# Patient Record
Sex: Female | Born: 1945 | Race: White | Hispanic: No | Marital: Married | Smoking: Former smoker
Health system: Southern US, Community
[De-identification: ages and names within clinical notes are randomized; demographics above are authoritative.]

## PROBLEM LIST (undated history)

## (undated) DIAGNOSIS — K219 Gastro-esophageal reflux disease without esophagitis: Secondary | ICD-10-CM

## (undated) DIAGNOSIS — E7800 Pure hypercholesterolemia, unspecified: Secondary | ICD-10-CM

## (undated) DIAGNOSIS — I10 Essential (primary) hypertension: Secondary | ICD-10-CM

## (undated) DIAGNOSIS — F259 Schizoaffective disorder, unspecified: Secondary | ICD-10-CM

## (undated) DIAGNOSIS — F250 Schizoaffective disorder, bipolar type: Secondary | ICD-10-CM

## (undated) HISTORY — PX: MASTECTOMY: SHX3

---

## 1997-10-16 ENCOUNTER — Other Ambulatory Visit: Admission: RE | Admit: 1997-10-16 | Discharge: 1997-10-16 | Payer: Self-pay | Admitting: Endocrinology

## 1997-11-06 ENCOUNTER — Ambulatory Visit (HOSPITAL_COMMUNITY): Admission: RE | Admit: 1997-11-06 | Discharge: 1997-11-06 | Payer: Self-pay | Admitting: General Surgery

## 1998-01-16 ENCOUNTER — Other Ambulatory Visit: Admission: RE | Admit: 1998-01-16 | Discharge: 1998-01-16 | Payer: Self-pay | Admitting: Obstetrics and Gynecology

## 1998-02-05 ENCOUNTER — Ambulatory Visit (HOSPITAL_COMMUNITY): Admission: RE | Admit: 1998-02-05 | Discharge: 1998-02-05 | Payer: Self-pay | Admitting: Obstetrics and Gynecology

## 1998-03-10 ENCOUNTER — Inpatient Hospital Stay (HOSPITAL_COMMUNITY): Admission: RE | Admit: 1998-03-10 | Discharge: 1998-03-12 | Payer: Self-pay | Admitting: Obstetrics and Gynecology

## 1998-05-22 ENCOUNTER — Encounter: Payer: Self-pay | Admitting: General Surgery

## 1998-05-22 ENCOUNTER — Ambulatory Visit (HOSPITAL_COMMUNITY): Admission: RE | Admit: 1998-05-22 | Discharge: 1998-05-22 | Payer: Self-pay | Admitting: General Surgery

## 1998-06-01 ENCOUNTER — Ambulatory Visit (HOSPITAL_COMMUNITY): Admission: RE | Admit: 1998-06-01 | Discharge: 1998-06-01 | Payer: Self-pay | Admitting: General Surgery

## 1998-08-11 ENCOUNTER — Other Ambulatory Visit: Admission: RE | Admit: 1998-08-11 | Discharge: 1998-08-11 | Payer: Self-pay | Admitting: Obstetrics and Gynecology

## 1998-11-10 ENCOUNTER — Ambulatory Visit (HOSPITAL_COMMUNITY): Admission: RE | Admit: 1998-11-10 | Discharge: 1998-11-10 | Payer: Self-pay | Admitting: General Surgery

## 1998-11-13 ENCOUNTER — Other Ambulatory Visit: Admission: RE | Admit: 1998-11-13 | Discharge: 1998-11-13 | Payer: Self-pay | Admitting: Obstetrics and Gynecology

## 1999-03-08 ENCOUNTER — Other Ambulatory Visit: Admission: RE | Admit: 1999-03-08 | Discharge: 1999-03-08 | Payer: Self-pay | Admitting: Obstetrics and Gynecology

## 1999-05-13 ENCOUNTER — Encounter: Payer: Self-pay | Admitting: Endocrinology

## 1999-05-13 ENCOUNTER — Ambulatory Visit (HOSPITAL_COMMUNITY): Admission: RE | Admit: 1999-05-13 | Discharge: 1999-05-13 | Payer: Self-pay | Admitting: Endocrinology

## 1999-07-09 ENCOUNTER — Other Ambulatory Visit: Admission: RE | Admit: 1999-07-09 | Discharge: 1999-07-09 | Payer: Self-pay | Admitting: Obstetrics and Gynecology

## 1999-11-08 ENCOUNTER — Other Ambulatory Visit: Admission: RE | Admit: 1999-11-08 | Discharge: 1999-11-08 | Payer: Self-pay | Admitting: Obstetrics and Gynecology

## 2000-03-10 ENCOUNTER — Other Ambulatory Visit: Admission: RE | Admit: 2000-03-10 | Discharge: 2000-03-10 | Payer: Self-pay | Admitting: Obstetrics and Gynecology

## 2000-05-19 ENCOUNTER — Ambulatory Visit (HOSPITAL_COMMUNITY): Admission: RE | Admit: 2000-05-19 | Discharge: 2000-05-19 | Payer: Self-pay | Admitting: Obstetrics and Gynecology

## 2000-05-19 ENCOUNTER — Encounter: Payer: Self-pay | Admitting: Obstetrics and Gynecology

## 2000-09-18 ENCOUNTER — Other Ambulatory Visit: Admission: RE | Admit: 2000-09-18 | Discharge: 2000-09-18 | Payer: Self-pay | Admitting: Obstetrics and Gynecology

## 2000-11-27 ENCOUNTER — Encounter: Admission: RE | Admit: 2000-11-27 | Discharge: 2001-01-24 | Payer: Self-pay | Admitting: Endocrinology

## 2001-03-19 ENCOUNTER — Other Ambulatory Visit: Admission: RE | Admit: 2001-03-19 | Discharge: 2001-03-19 | Payer: Self-pay | Admitting: Obstetrics and Gynecology

## 2001-05-21 ENCOUNTER — Ambulatory Visit (HOSPITAL_COMMUNITY): Admission: RE | Admit: 2001-05-21 | Discharge: 2001-05-21 | Payer: Self-pay | Admitting: Endocrinology

## 2001-05-21 ENCOUNTER — Encounter: Payer: Self-pay | Admitting: Endocrinology

## 2001-09-20 ENCOUNTER — Other Ambulatory Visit: Admission: RE | Admit: 2001-09-20 | Discharge: 2001-09-20 | Payer: Self-pay | Admitting: Obstetrics and Gynecology

## 2002-03-25 ENCOUNTER — Other Ambulatory Visit: Admission: RE | Admit: 2002-03-25 | Discharge: 2002-03-25 | Payer: Self-pay | Admitting: Obstetrics and Gynecology

## 2002-05-24 ENCOUNTER — Ambulatory Visit (HOSPITAL_COMMUNITY): Admission: RE | Admit: 2002-05-24 | Discharge: 2002-05-24 | Payer: Self-pay | Admitting: Obstetrics and Gynecology

## 2002-05-24 ENCOUNTER — Encounter: Payer: Self-pay | Admitting: Obstetrics and Gynecology

## 2002-09-16 ENCOUNTER — Other Ambulatory Visit: Admission: RE | Admit: 2002-09-16 | Discharge: 2002-09-16 | Payer: Self-pay | Admitting: Obstetrics and Gynecology

## 2003-03-20 ENCOUNTER — Other Ambulatory Visit: Admission: RE | Admit: 2003-03-20 | Discharge: 2003-03-20 | Payer: Self-pay | Admitting: Obstetrics and Gynecology

## 2003-05-26 ENCOUNTER — Ambulatory Visit (HOSPITAL_COMMUNITY): Admission: RE | Admit: 2003-05-26 | Discharge: 2003-05-26 | Payer: Self-pay | Admitting: Obstetrics and Gynecology

## 2003-09-12 ENCOUNTER — Encounter: Admission: RE | Admit: 2003-09-12 | Discharge: 2003-09-12 | Payer: Self-pay | Admitting: Endocrinology

## 2003-09-25 ENCOUNTER — Encounter (INDEPENDENT_AMBULATORY_CARE_PROVIDER_SITE_OTHER): Payer: Self-pay | Admitting: *Deleted

## 2003-09-25 ENCOUNTER — Ambulatory Visit (HOSPITAL_COMMUNITY): Admission: RE | Admit: 2003-09-25 | Discharge: 2003-09-25 | Payer: Self-pay | Admitting: *Deleted

## 2003-10-13 ENCOUNTER — Ambulatory Visit (HOSPITAL_COMMUNITY): Admission: RE | Admit: 2003-10-13 | Discharge: 2003-10-13 | Payer: Self-pay | Admitting: General Surgery

## 2003-10-15 ENCOUNTER — Inpatient Hospital Stay (HOSPITAL_COMMUNITY): Admission: RE | Admit: 2003-10-15 | Discharge: 2003-10-21 | Payer: Self-pay | Admitting: General Surgery

## 2003-10-15 ENCOUNTER — Encounter (INDEPENDENT_AMBULATORY_CARE_PROVIDER_SITE_OTHER): Payer: Self-pay | Admitting: Specialist

## 2003-11-04 ENCOUNTER — Ambulatory Visit: Admission: RE | Admit: 2003-11-04 | Discharge: 2003-11-04 | Payer: Self-pay | Admitting: Oncology

## 2003-11-06 ENCOUNTER — Ambulatory Visit (HOSPITAL_COMMUNITY): Admission: RE | Admit: 2003-11-06 | Discharge: 2003-11-06 | Payer: Self-pay | Admitting: Oncology

## 2003-11-07 ENCOUNTER — Ambulatory Visit (HOSPITAL_COMMUNITY): Admission: RE | Admit: 2003-11-07 | Discharge: 2003-11-07 | Payer: Self-pay | Admitting: Oncology

## 2003-11-17 ENCOUNTER — Ambulatory Visit: Admission: RE | Admit: 2003-11-17 | Discharge: 2004-01-07 | Payer: Self-pay | Admitting: *Deleted

## 2003-11-28 ENCOUNTER — Ambulatory Visit (HOSPITAL_COMMUNITY): Admission: RE | Admit: 2003-11-28 | Discharge: 2003-11-28 | Payer: Self-pay | Admitting: General Surgery

## 2004-01-05 ENCOUNTER — Observation Stay (HOSPITAL_COMMUNITY): Admission: EM | Admit: 2004-01-05 | Discharge: 2004-01-06 | Payer: Self-pay | Admitting: Oncology

## 2004-01-26 ENCOUNTER — Ambulatory Visit (HOSPITAL_COMMUNITY): Admission: RE | Admit: 2004-01-26 | Discharge: 2004-01-26 | Payer: Self-pay | Admitting: Oncology

## 2004-05-07 ENCOUNTER — Ambulatory Visit: Payer: Self-pay | Admitting: Oncology

## 2004-06-01 ENCOUNTER — Ambulatory Visit (HOSPITAL_COMMUNITY): Admission: RE | Admit: 2004-06-01 | Discharge: 2004-06-01 | Payer: Self-pay | Admitting: Oncology

## 2004-06-25 ENCOUNTER — Other Ambulatory Visit: Admission: RE | Admit: 2004-06-25 | Discharge: 2004-06-25 | Payer: Self-pay | Admitting: *Deleted

## 2004-07-14 ENCOUNTER — Encounter: Admission: RE | Admit: 2004-07-14 | Discharge: 2004-07-14 | Payer: Self-pay | Admitting: Dermatology

## 2004-07-29 ENCOUNTER — Ambulatory Visit: Payer: Self-pay | Admitting: Oncology

## 2004-08-25 ENCOUNTER — Encounter: Admission: RE | Admit: 2004-08-25 | Discharge: 2004-08-25 | Payer: Self-pay | Admitting: Oncology

## 2004-09-16 ENCOUNTER — Ambulatory Visit: Payer: Self-pay | Admitting: Oncology

## 2004-11-18 ENCOUNTER — Ambulatory Visit: Payer: Self-pay | Admitting: Oncology

## 2005-01-03 ENCOUNTER — Ambulatory Visit: Payer: Self-pay | Admitting: Oncology

## 2005-01-21 ENCOUNTER — Encounter: Admission: RE | Admit: 2005-01-21 | Discharge: 2005-01-21 | Payer: Self-pay | Admitting: Endocrinology

## 2005-03-02 ENCOUNTER — Encounter: Admission: RE | Admit: 2005-03-02 | Discharge: 2005-03-02 | Payer: Self-pay | Admitting: Oncology

## 2005-03-04 ENCOUNTER — Ambulatory Visit: Payer: Self-pay | Admitting: Oncology

## 2005-05-30 ENCOUNTER — Ambulatory Visit: Payer: Self-pay | Admitting: Oncology

## 2005-07-15 ENCOUNTER — Ambulatory Visit: Payer: Self-pay | Admitting: Oncology

## 2005-10-17 ENCOUNTER — Ambulatory Visit: Payer: Self-pay | Admitting: Oncology

## 2005-10-26 ENCOUNTER — Encounter: Payer: Self-pay | Admitting: General Surgery

## 2005-11-07 IMAGING — CR DG CHEST 2V
2 series · 2 of 2 positions shown · non-contrast
Comparison: none.

CLINICAL DATA: Colon mass.  Preadmission chest x-ray.
 TWO VIEW CHEST

[view not recorded (1 of 2)]
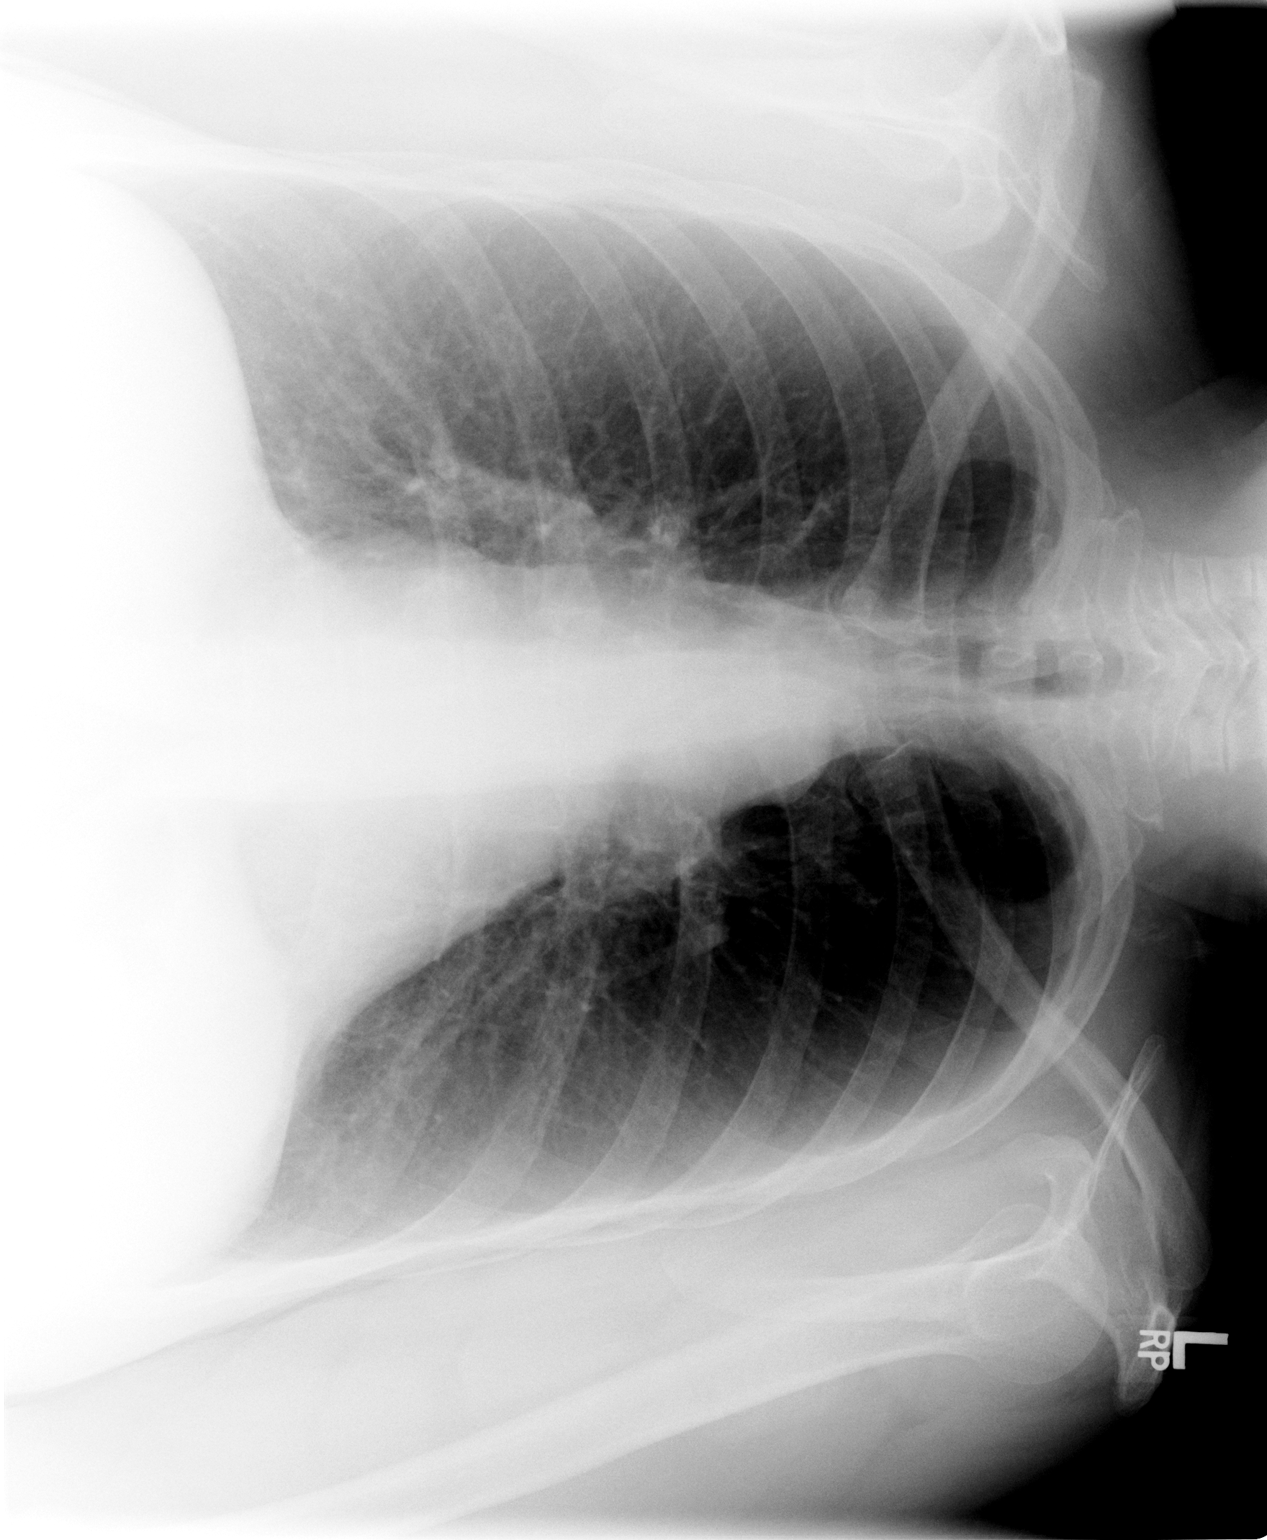

[view not recorded (2 of 2)]
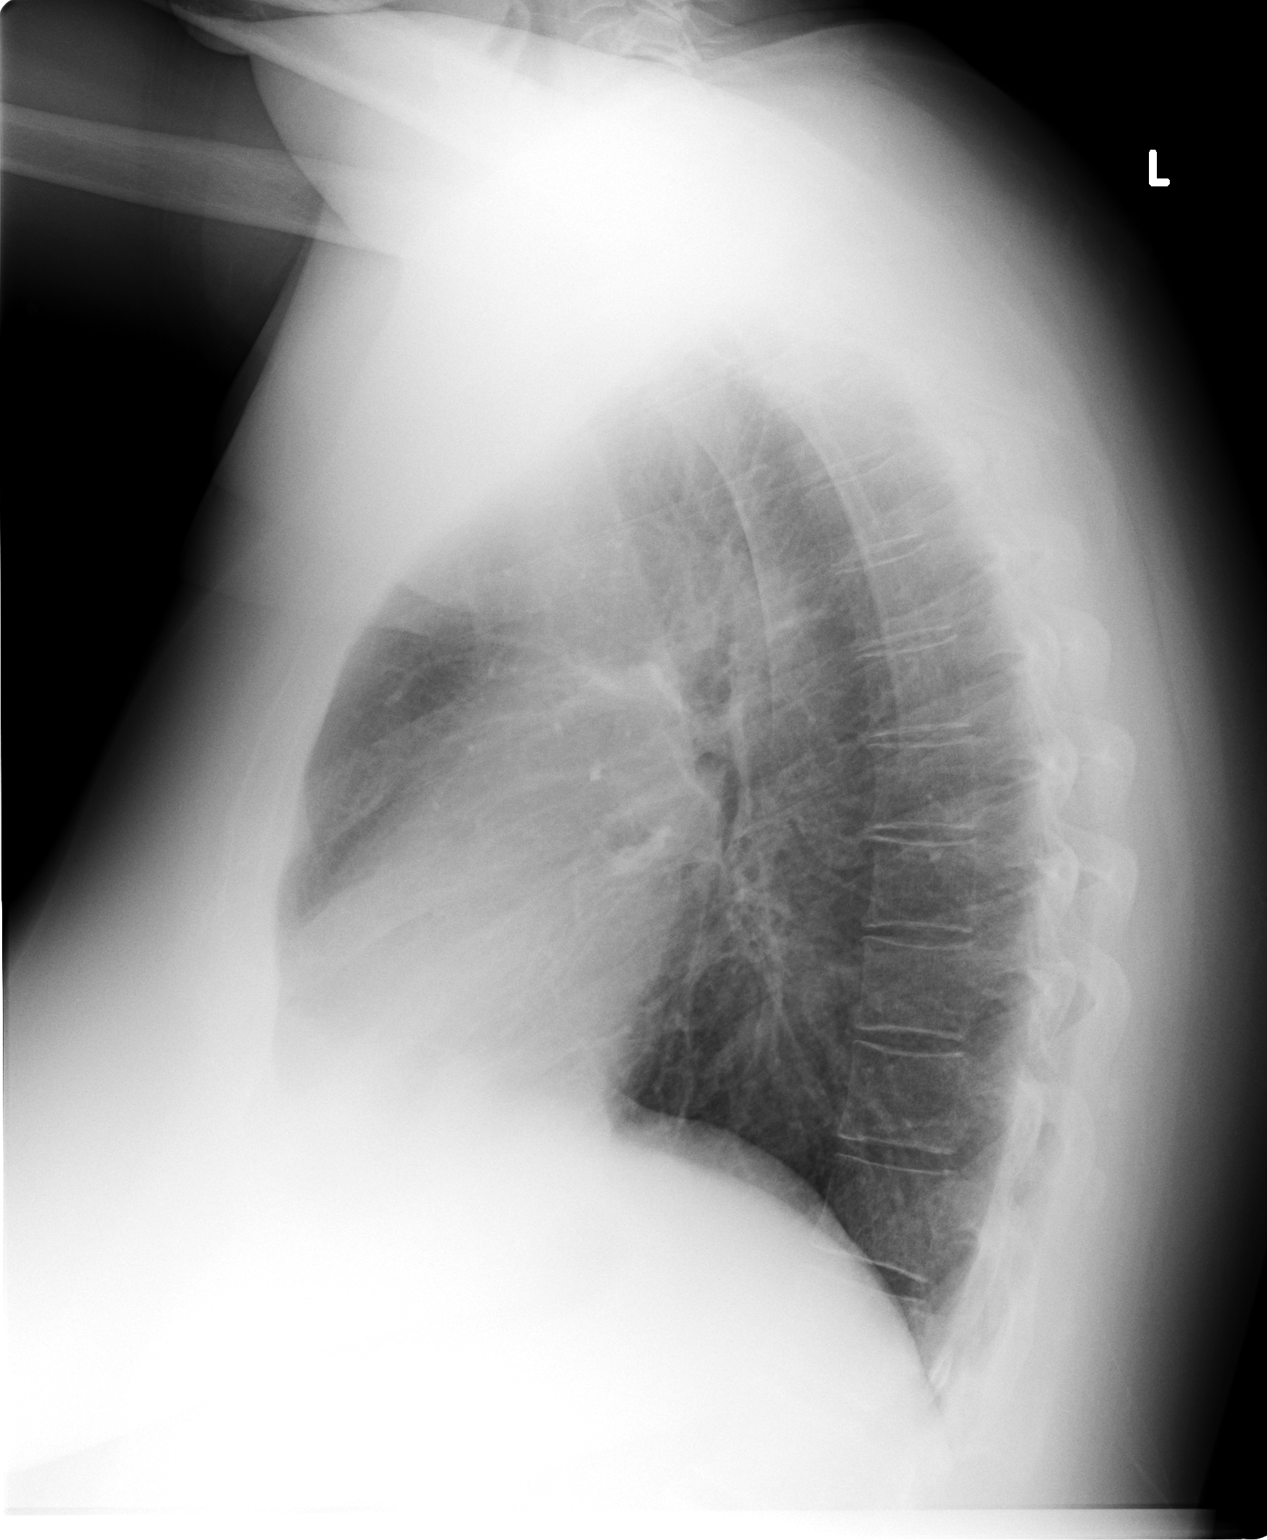

[2 of 2 positions shown; findings below may reference images not displayed]

FINDINGS: Two view exam of the chest shows no focal consolidation, edema or effusion.  The cardiopericardial silhouette is within normal limits for size.  The visualized bony structures are intact.
 IMPRESSION
 No acute cardiopulmonary process.

## 2005-12-05 ENCOUNTER — Ambulatory Visit (HOSPITAL_COMMUNITY): Admission: RE | Admit: 2005-12-05 | Discharge: 2005-12-05 | Payer: Self-pay | Admitting: Oncology

## 2005-12-05 ENCOUNTER — Ambulatory Visit: Payer: Self-pay | Admitting: Oncology

## 2005-12-05 LAB — COMPREHENSIVE METABOLIC PANEL
ALT: 17 U/L (ref 0–40)
AST: 21 U/L (ref 0–37)
BUN: 6 mg/dL (ref 6–23)
CO2: 31 mEq/L (ref 19–32)
Chloride: 85 mEq/L — ABNORMAL LOW (ref 96–112)
Creatinine, Ser: 0.88 mg/dL (ref 0.40–1.20)
Glucose, Bld: 124 mg/dL — ABNORMAL HIGH (ref 70–99)
Sodium: 128 mEq/L — ABNORMAL LOW (ref 135–145)
Total Bilirubin: 0.7 mg/dL (ref 0.3–1.2)
Total Protein: 6.8 g/dL (ref 6.0–8.3)

## 2005-12-12 LAB — BASIC METABOLIC PANEL
BUN: 8 mg/dL (ref 6–23)
Calcium: 8.8 mg/dL (ref 8.4–10.5)
Glucose, Bld: 106 mg/dL — ABNORMAL HIGH (ref 70–99)
Potassium: 4.1 mEq/L (ref 3.5–5.3)

## 2006-01-24 ENCOUNTER — Ambulatory Visit: Payer: Self-pay | Admitting: Oncology

## 2006-03-07 LAB — COMPREHENSIVE METABOLIC PANEL
ALT: 11 U/L (ref 0–40)
Chloride: 88 mEq/L — ABNORMAL LOW (ref 96–112)
Sodium: 127 mEq/L — ABNORMAL LOW (ref 135–145)
Total Bilirubin: 0.4 mg/dL (ref 0.3–1.2)

## 2006-03-07 LAB — CEA: CEA: <0.5 ng/mL (ref 0.0–5.0)

## 2006-04-21 ENCOUNTER — Ambulatory Visit: Payer: Self-pay | Admitting: Oncology

## 2006-04-25 LAB — COMPREHENSIVE METABOLIC PANEL
AST: 18 U/L (ref 0–37)
Albumin: 4.3 g/dL (ref 3.5–5.2)
Alkaline Phosphatase: 198 U/L — ABNORMAL HIGH (ref 39–117)
BUN: 9 mg/dL (ref 6–23)
Creatinine, Ser: 0.97 mg/dL (ref 0.40–1.20)
Glucose, Bld: 104 mg/dL — ABNORMAL HIGH (ref 70–99)
Potassium: 3.4 mEq/L — ABNORMAL LOW (ref 3.5–5.3)
Sodium: 125 mEq/L — ABNORMAL LOW (ref 135–145)
Total Bilirubin: 0.5 mg/dL (ref 0.3–1.2)
Total Protein: 7.2 g/dL (ref 6.0–8.3)

## 2006-04-25 LAB — CEA: CEA: <0.5 ng/mL (ref 0.0–5.0)

## 2006-06-05 ENCOUNTER — Ambulatory Visit (HOSPITAL_COMMUNITY): Admission: RE | Admit: 2006-06-05 | Discharge: 2006-06-05 | Payer: Self-pay | Admitting: Oncology

## 2006-06-06 ENCOUNTER — Ambulatory Visit: Payer: Self-pay | Admitting: Oncology

## 2006-07-01 ENCOUNTER — Emergency Department (HOSPITAL_COMMUNITY): Admission: EM | Admit: 2006-07-01 | Discharge: 2006-07-02 | Payer: Self-pay | Admitting: Emergency Medicine

## 2006-07-02 ENCOUNTER — Inpatient Hospital Stay (HOSPITAL_COMMUNITY): Admission: AD | Admit: 2006-07-02 | Discharge: 2006-07-02 | Payer: Self-pay | Admitting: *Deleted

## 2006-07-02 ENCOUNTER — Ambulatory Visit: Payer: Self-pay | Admitting: *Deleted

## 2006-07-03 ENCOUNTER — Inpatient Hospital Stay (HOSPITAL_COMMUNITY): Admission: EM | Admit: 2006-07-03 | Discharge: 2006-07-03 | Payer: Self-pay | Admitting: Emergency Medicine

## 2006-07-26 ENCOUNTER — Inpatient Hospital Stay (HOSPITAL_COMMUNITY): Admission: AD | Admit: 2006-07-26 | Discharge: 2006-07-31 | Payer: Self-pay | Admitting: *Deleted

## 2006-08-24 ENCOUNTER — Ambulatory Visit: Payer: Self-pay | Admitting: Oncology

## 2006-10-11 ENCOUNTER — Ambulatory Visit: Payer: Self-pay | Admitting: Oncology

## 2006-11-30 ENCOUNTER — Ambulatory Visit: Payer: Self-pay | Admitting: Oncology

## 2007-01-11 ENCOUNTER — Ambulatory Visit: Payer: Self-pay | Admitting: Oncology

## 2007-03-01 ENCOUNTER — Ambulatory Visit: Payer: Self-pay | Admitting: Oncology

## 2007-04-16 ENCOUNTER — Ambulatory Visit: Payer: Self-pay | Admitting: Oncology

## 2007-04-16 LAB — CEA: CEA: <0.5 ng/mL (ref 0.0–5.0)

## 2007-05-30 ENCOUNTER — Ambulatory Visit: Payer: Self-pay | Admitting: Oncology

## 2007-08-01 ENCOUNTER — Ambulatory Visit: Payer: Self-pay | Admitting: Oncology

## 2007-09-12 ENCOUNTER — Ambulatory Visit: Payer: Self-pay | Admitting: Oncology

## 2007-10-24 ENCOUNTER — Ambulatory Visit: Payer: Self-pay | Admitting: Oncology

## 2007-12-05 ENCOUNTER — Ambulatory Visit: Payer: Self-pay | Admitting: Oncology

## 2008-01-16 ENCOUNTER — Ambulatory Visit: Payer: Self-pay | Admitting: Oncology

## 2008-03-04 ENCOUNTER — Ambulatory Visit: Payer: Self-pay | Admitting: Oncology

## 2008-06-06 ENCOUNTER — Ambulatory Visit: Payer: Self-pay | Admitting: Oncology

## 2008-06-30 IMAGING — CT CT CHEST W/ CM
2 of 5 series · 16 of 46 positions shown, 18 images · IV contrast (omnipaque)
Comparison: CTs of the chest, abdomen, and pelvis done 12/05/05.

CLINICAL DATA: Colon cancer.  Status post colon resection in 9441.  Restaging post-chemotherapy.
CHEST CT WITH CONTRAST ? 06/05/06:
TECHNIQUE: Multidetector CT imaging of the chest was performed following the standard protocol during bolus administration of intravenous contrast.   
Contrast:  150 cc Omnipaque 300 IV.
TECHNIQUE: Multidetector CT imaging of the abdomen was performed following the standard protocol during bolus administration of intravenous contrast.
TECHNIQUE: Multidetector CT imaging of the pelvis was performed following the standard protocol during bolus administration of intravenous contrast.

[Series 2: cap 5.0 b40f · axial · 0.82mm/px · z∈[-648,-88]mm · 13 of 126 slices shown, 15 images]
[im 7/126  soft-tissue]
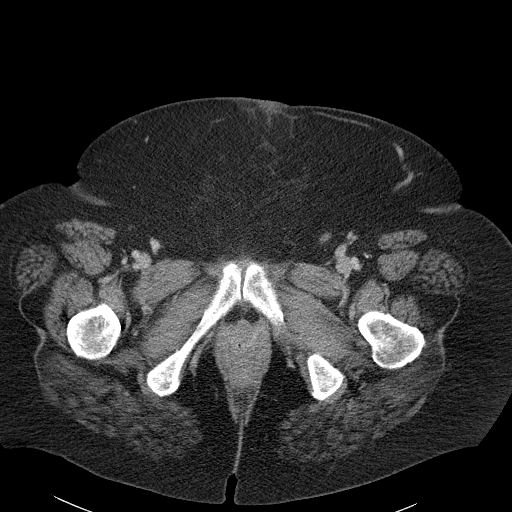
[im 7/126  bone]
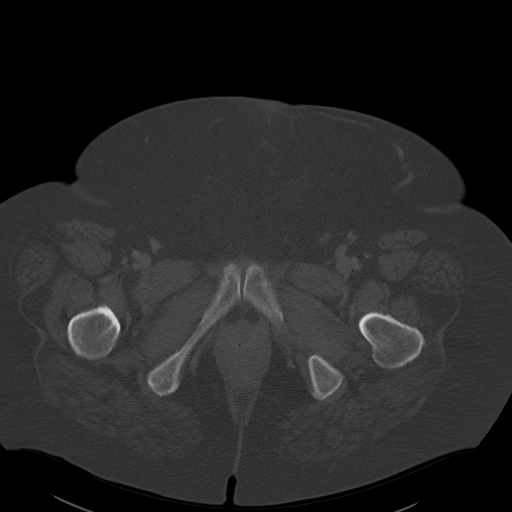
[im 20/126  soft-tissue]
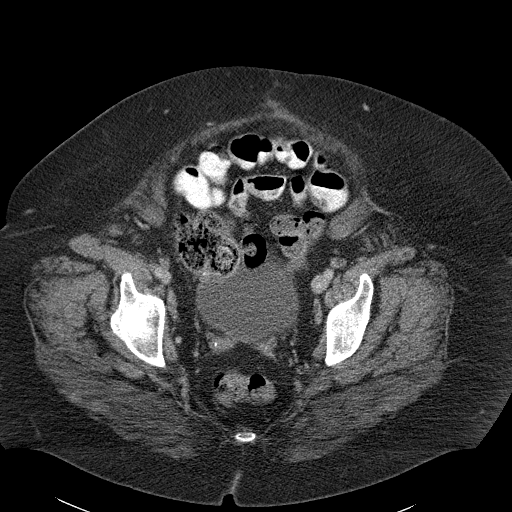
[im 27/126  soft-tissue]
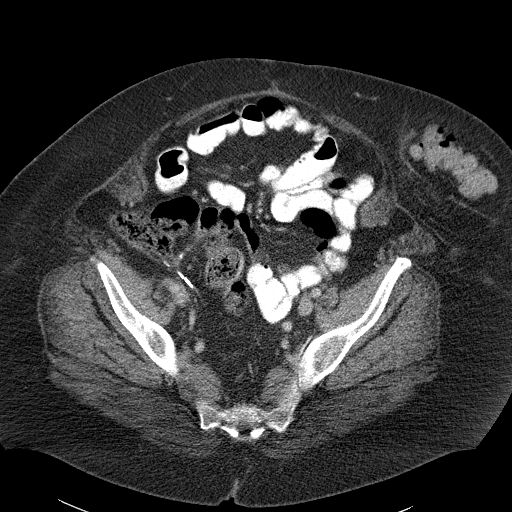
[im 33/126  soft-tissue]
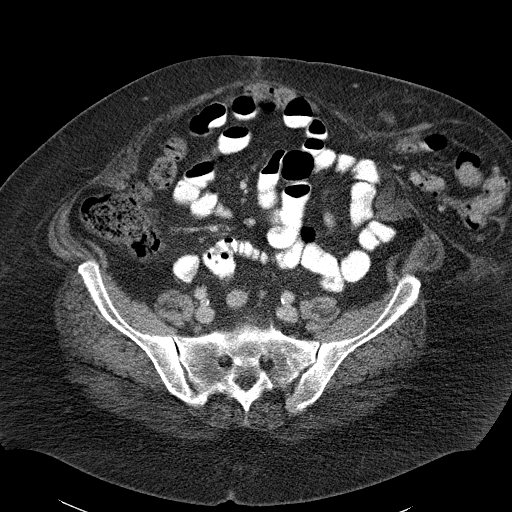
[im 47/126  soft-tissue]
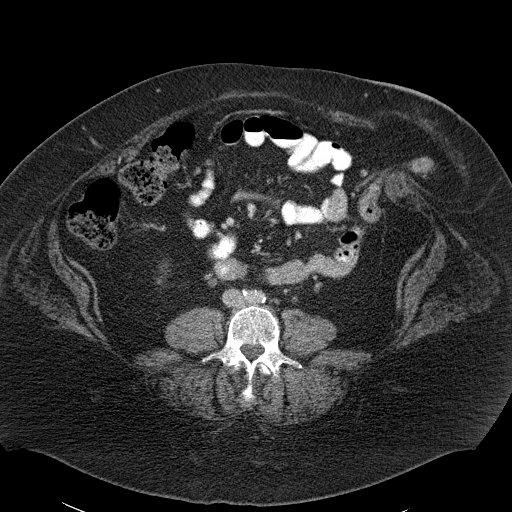
[im 53/126  soft-tissue]
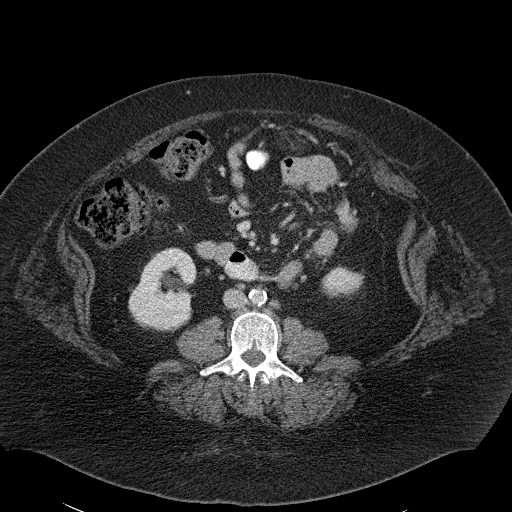
[im 66/126  soft-tissue]
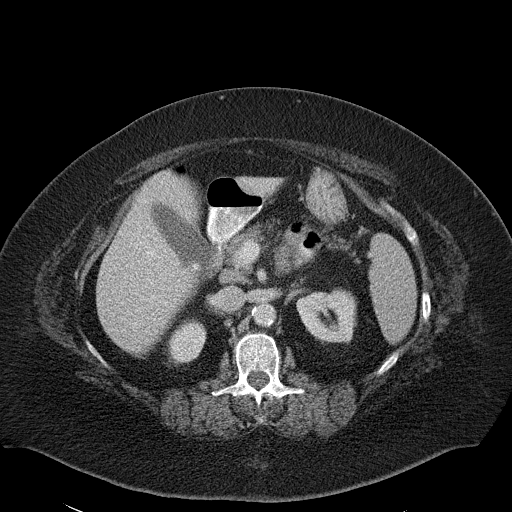
[im 73/126  soft-tissue]
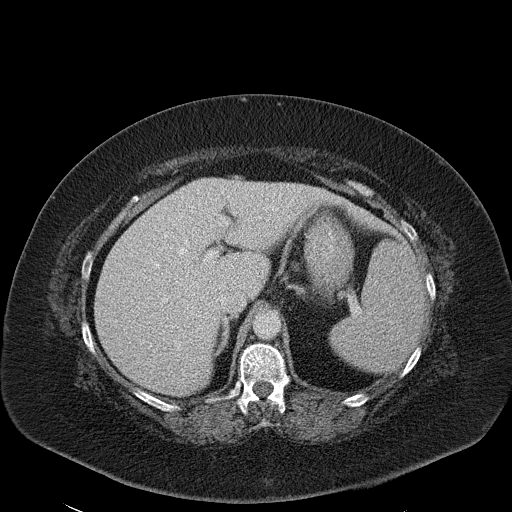
[im 79/126  soft-tissue]
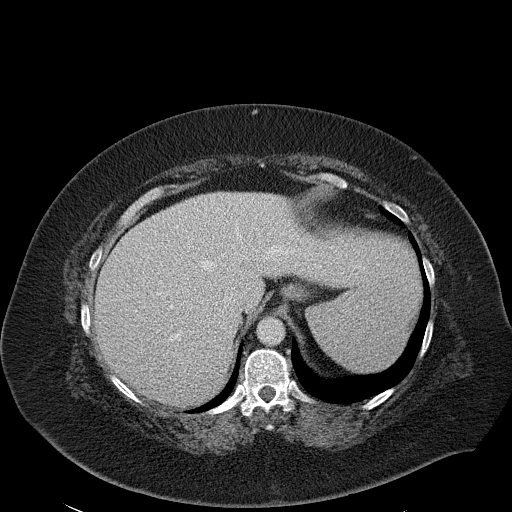
[im 79/126  bone]
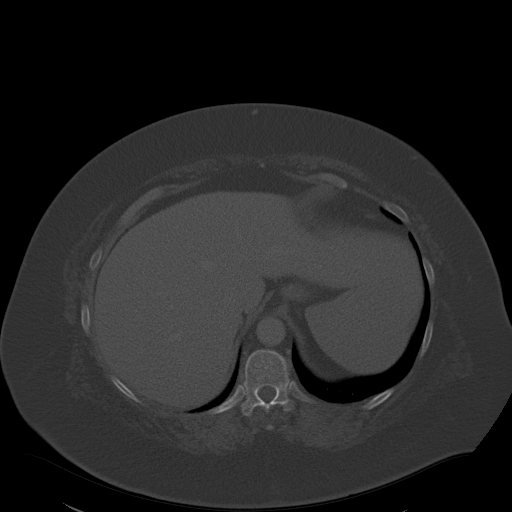
[im 93/126  soft-tissue]
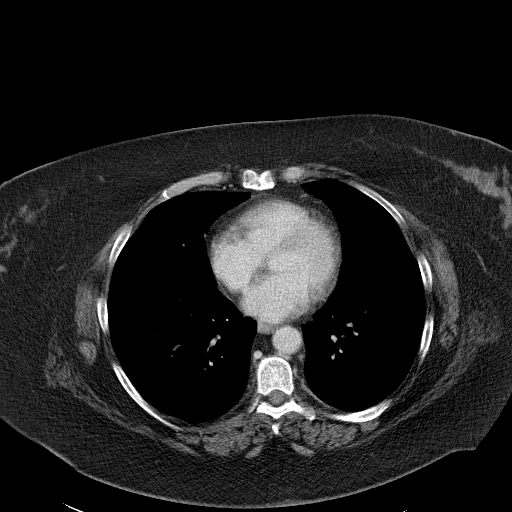
[im 99/126  soft-tissue]
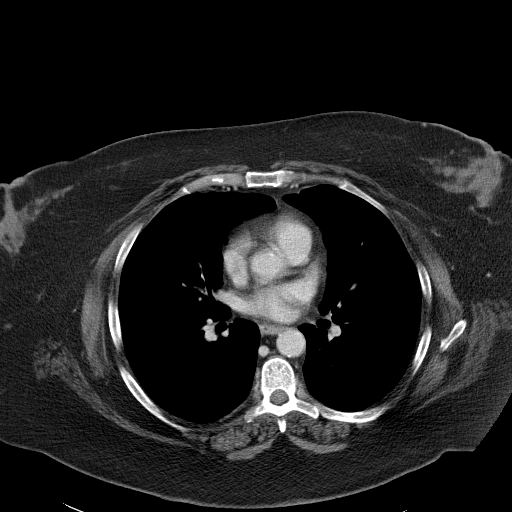
[im 106/126  soft-tissue]
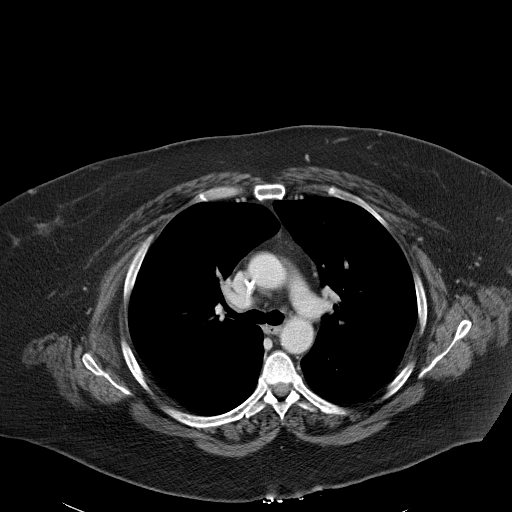
[im 119/126  soft-tissue]
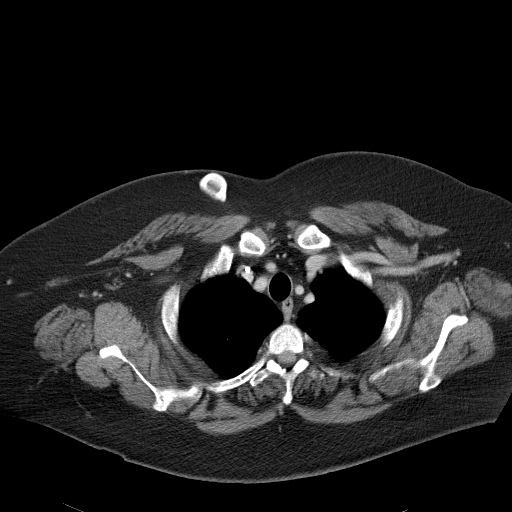

[Series 602: <mpr thick range> · coronal · 1.23mm/px · 3 of 92 slices shown]
[im 31/92  soft-tissue]
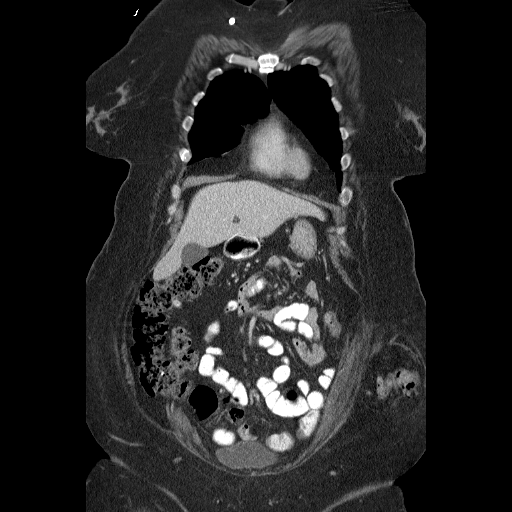
[im 41/92  soft-tissue]
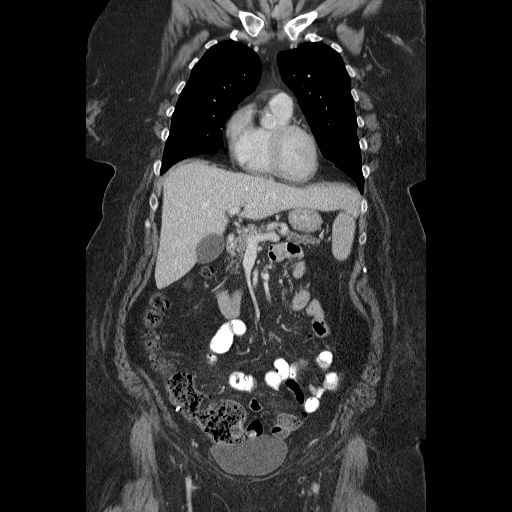
[im 51/92  soft-tissue]
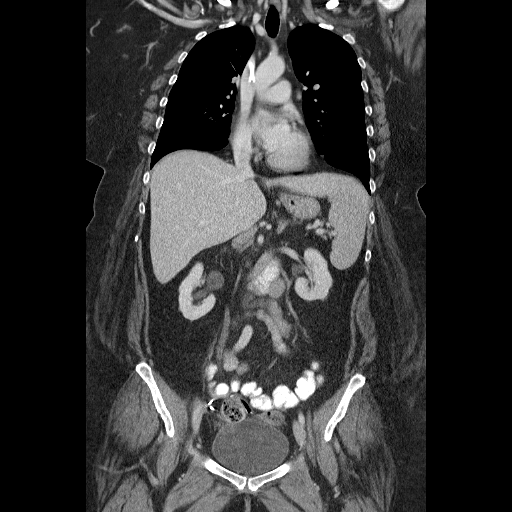

[16 of 46 positions shown; findings below may reference images not displayed]

FINDINGS: There is mild scarring at the lung apices.  The lungs are otherwise clear.  There is no pleural or pericardial effusion.  No pathologically enlarged lymph nodes are identified.  The Port-a-cath tip remains in the SVC.
IMPRESSION: Stable CT of the chest.  No evidence of metastatic disease.
ABDOMEN CT WITH CONTRAST ? 06/05/06:
FINDINGS: The liver has a stable appearance without evidence of metastases.  Small gallstones are again noted.  There is no gallbladder wall thickening.  The spleen, pancreas, adrenal glands, and kidneys appear stable.  In the upper pole of the left kidney, there is a stable bilobed or septated cyst measuring up to 3.8 cm in diameter.  No enlarged abdominal lymph nodes are seen.  There is no ascites.
IMPRESSION: Stable CT of the abdomen.  No evidence of metastatic disease.  Gallstones and a left renal cyst are again noted.
PELVIS CT WITH CONTRAST ? 06/05/06:
FINDINGS: A transverse colostomy in the left lower quadrant is again noted with an associated parastomal hernia containing additional loops of colon.  There is no evidence of bowel incarceration or obstruction.  No pelvic mass, fluid collection, or inflammatory process is demonstrated.  The uterus is surgically absent.
IMPRESSION: Stable postoperative CT of the pelvis with a parastomal hernia in the left lower quadrant.  No evidence of local recurrence or metastatic disease.

## 2008-07-22 ENCOUNTER — Ambulatory Visit: Payer: Self-pay | Admitting: Oncology

## 2008-10-10 ENCOUNTER — Ambulatory Visit: Payer: Self-pay | Admitting: Oncology

## 2008-11-25 ENCOUNTER — Ambulatory Visit: Payer: Self-pay | Admitting: Oncology

## 2009-01-02 ENCOUNTER — Ambulatory Visit: Payer: Self-pay | Admitting: Oncology

## 2009-02-13 ENCOUNTER — Ambulatory Visit: Payer: Self-pay | Admitting: Oncology

## 2009-03-27 ENCOUNTER — Ambulatory Visit: Payer: Self-pay | Admitting: Oncology

## 2009-05-08 ENCOUNTER — Ambulatory Visit: Payer: Self-pay | Admitting: Oncology

## 2009-06-18 ENCOUNTER — Ambulatory Visit: Payer: Self-pay | Admitting: Oncology

## 2009-07-30 ENCOUNTER — Ambulatory Visit: Payer: Self-pay | Admitting: Oncology

## 2009-09-10 ENCOUNTER — Ambulatory Visit: Payer: Self-pay | Admitting: Oncology

## 2009-10-23 ENCOUNTER — Ambulatory Visit: Payer: Self-pay | Admitting: Oncology

## 2009-12-03 ENCOUNTER — Ambulatory Visit: Payer: Self-pay | Admitting: Oncology

## 2010-01-14 ENCOUNTER — Ambulatory Visit: Payer: Self-pay | Admitting: Oncology

## 2010-02-24 ENCOUNTER — Ambulatory Visit: Payer: Self-pay | Admitting: Oncology

## 2010-04-08 ENCOUNTER — Ambulatory Visit: Payer: Self-pay | Admitting: Oncology

## 2010-05-19 ENCOUNTER — Ambulatory Visit: Payer: Self-pay | Admitting: Oncology

## 2010-07-01 ENCOUNTER — Ambulatory Visit: Payer: Self-pay | Admitting: Oncology

## 2010-07-18 ENCOUNTER — Encounter: Payer: Self-pay | Admitting: Oncology

## 2010-08-16 ENCOUNTER — Encounter (HOSPITAL_BASED_OUTPATIENT_CLINIC_OR_DEPARTMENT_OTHER): Payer: Medicare Other | Admitting: Oncology

## 2010-08-16 DIAGNOSIS — Z452 Encounter for adjustment and management of vascular access device: Secondary | ICD-10-CM

## 2010-08-16 DIAGNOSIS — Z85038 Personal history of other malignant neoplasm of large intestine: Secondary | ICD-10-CM

## 2010-09-27 ENCOUNTER — Encounter (HOSPITAL_BASED_OUTPATIENT_CLINIC_OR_DEPARTMENT_OTHER): Payer: Medicare Other | Admitting: Oncology

## 2010-09-27 ENCOUNTER — Other Ambulatory Visit: Payer: Self-pay | Admitting: Oncology

## 2010-09-27 DIAGNOSIS — Z85038 Personal history of other malignant neoplasm of large intestine: Secondary | ICD-10-CM

## 2010-09-27 LAB — CEA: CEA: 0.8 ng/mL (ref 0.0–5.0)

## 2010-11-09 ENCOUNTER — Encounter (HOSPITAL_BASED_OUTPATIENT_CLINIC_OR_DEPARTMENT_OTHER): Payer: Medicare Other | Admitting: Oncology

## 2010-11-09 DIAGNOSIS — Z85038 Personal history of other malignant neoplasm of large intestine: Secondary | ICD-10-CM

## 2010-11-09 DIAGNOSIS — Z452 Encounter for adjustment and management of vascular access device: Secondary | ICD-10-CM

## 2010-11-12 NOTE — Op Note (Signed)
NAME:  Diane Schroeder, Diane Schroeder                            ACCOUNT NO.:  [id number]   MEDICAL RECORD NO.:  [id number]                   PATIENT TYPE:  AMB   LOCATION:  DAY                                  FACILITY:  [facility]   PHYSICIAN:  Angelia Mould. Derrell Lolling, M.D.             DATE OF BIRTH:  1945/12/22   DATE OF PROCEDURE:  11/28/2003  DATE OF DISCHARGE:                                 OPERATIVE REPORT   PREOPERATIVE DIAGNOSIS:  Colon cancer.   POSTOPERATIVE DIAGNOSIS:  Colon cancer.   OPERATION PERFORMED:  Insertion of Bard Port venous vascular access device.   SURGEON:  Dr. Claud Kelp   OPERATIVE INDICATIONS:  This is Schroeder 65 year old white female with Schroeder history of  colon cancer.  Her cancer was complicated by extension and invasion of the  right iliac lymph node chain and right iliac blood vessels, and she  underwent resection, but she has residual tumor in the right iliac area.  Dr. Jackelyn Knife and Dr. Mancel Bale are going to give her chemotherapy and  radiation therapy.  Dr. Truett Perna requested that I place Schroeder Port-Schroeder-Cath.  The  patient has been counseled regarding this procedure and its risks.  She is  agreeable to this and is brought to the operating room electively.   OPERATIVE TECHNIQUE:  The patient was placed supine on the operating table.  Her arms were placed at her sides and rolled behind her shoulders.  The neck  and chest was prepped and draped in Schroeder sterile fashion.  Xylocaine 1% with  epinephrine was used as Schroeder local infiltration anesthetic.  With the patient  in Trendelenburg position, I performed Schroeder right internal jugular  venipuncture.  Schroeder guidewire was inserted into the superior vena cava under  fluoroscopic guidance.  Schroeder transverse incision was made approximately 2 cm  below the right clavicle.  Subcutaneous pocket was created.  Schroeder subcutaneous  tunnel was created with Schroeder tunneling device and the catheter withdrawn from  the neck incision down into the chest incision.  The  catheter was measured  an appropriate length and secured to the port.  The port and the catheter  were then flushed with heparinized saline.  The port was sutured to the  chest wall in two interrupted sutures of 3-0 Vicryl.  The patient developed  some ventricular bigeminy, and we pulled the guidewire back several times,  but this persisted.  Ultimately, she had to have some beta blocker given,  and that resolved the ventricular bigeminy.  With the patient back in  Trendelenburg position, we inserted Schroeder dilator and peel-away-sheath over the  guidewire.  The guidewire and dilator were removed.  The catheter was  threaded through the peel-away-sheath into the central venous circulation.  The peel-away-sheath was removed.  We had excellent blood return, and the  catheter flushed easily.  Fluoroscopy was performed and showed that the  catheter tip was near  the right atrium.  The patient had Schroeder little bit of  atrial arrhythmia, and so we shortened the catheter about 3 cm to where the  tip of the catheter was clearly in the superior vena cava.  We then re-  secured this to the port and then resutured the port to the chest wall with  two interrupted sutures of 3-0 Vicryl.  We then performed fluoroscopy and  confirmed that we were happy with the location of the catheter tip, as there  was no kinking or crimping of the catheter anywhere.  The catheter had  excellent blood return, and it flushed easily.  We then flushed it with  concentrated heparin.  Subcutaneous tissue was closed with interrupted  sutures of 3-0 Vicryl.  The skin incisions were closed with  subcuticular sutures of 4-0 Vicryl and Steri-Strips.  Clean bandages were  placed and the patient taken to the recovery room in stable condition.  Estimated blood loss was about 15 mL.  Complications were none.  Sponge,  needle, and instrument counts were correct.                                               Angelia Mould. Derrell Lolling, M.D.     HMI/MEDQ  D:  11/28/2003  T:  11/28/2003  Job:  [id number]   cc:   Jillyn Hidden B. Truett Perna, M.D.  [address] [facility]  [place]  [place] [address]  Fax: [phone]   Brooke Bonito, M.D.  [address]  [place]  [place] [address]  Fax: [phone]
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
## 2010-11-12 NOTE — H&P (Signed)
NAME:  Diane Schroeder, Diane Schroeder                  ACCOUNT NO.:  [id number]   MEDICAL RECORD NO.:  [id number]          PATIENT TYPE:  IPS   LOCATION:  0400                          [facility]ACILITY:  [facility]   PHYSICIAN:  Jasmine Pang, M.D. DATE OF BIRTH:  12/19/1945   DATE OF ADMISSION:  07/26/2006  DATE OF DISCHARGE:                       PSYCHIATRIC ADMISSION ASSESSMENT   IDENTIFYING INFORMATION:  This is a 65 year old married white female.  This is an involuntary admission.   HISTORY OF PRESENT ILLNESS:  This pleasant 76 year old mother of two was  petitioned by her family yesterday after she became somewhat aggressive  with her daughter, whom today she reports she believed was trying to  harm her.  The patient reports that she was diagnosed with schizophrenia  approximately 30 years ago, stopped taking all of her medicines about  one week ago, thinking hopefully that maybe she did not need them  anymore.  After about five days she began to experience auditory  hallucinations similar to past hallucinations.  She began to get  paranoid, slightly agitated, and became aggressive with her daughter.  Today she is fully alert, pleasant, cooperative, relaxed, focused, and  no signs of internal distraction.  Regrets stopping her medications.  Denies that she was having any particular side effects.  Denies any mood  lability, mania, or suicidal or homicidal thought.   PAST PSYCHIATRIC HISTORY:  The patient is currently followed as an  outpatient at [facility] by Dr. Jules Schick, MD, and  Lupita Leash is her counselor.  This is a second admission to [facility]  behavioral health center.  The patient was here briefly for a day  earlier this month but was transferred to the medical unit because of  some medical complications.  She reports being diagnosed with  schizophrenia approximately 30 years ago, in her younger years, at which  time she began hearing auditory hallucinations.  She denies any  family  history of auditory hallucinations.  She denies any history of mood  lability, mania or substance abuse.  Since that time she has been quite  stable over the years on Haldol with various benzodiazepines.  She was  hospitalized at that time.  She has no known history of suicide attempts  or history of violence.   SOCIAL HISTORY:  The patient has been married for 41 years, has two  grown daughters that live in the area and several grandchildren.  Keeps  a 1 year old granddaughter frequently with her at home.  Has a sister  who lives nearby in [place].  She is the youngest of 7 children, born  and raised in [place], [place].  She reports her marriage is  stable, her husband is supportive.  Relationships are good.  Finances  are stable.  Denies any psychosocial stressors.  Husband is disabled for  many years.  No legal issues.  The patient is completely independent in  all her self-care and medication management.  She has had some issues  with some medical problems in the past couple  of years and feels that  she is coping fairly well with it.  Her [employer] faith is one of her main  coping mechanisms.   ALCOHOL AND DRUG HISTORY:  The patient has a history of 35-year use of  tobacco, two packs per day, which she has stopped using tobacco about  two years ago.  Nondrinker.  No substance abuse.   PAST MEDICAL HISTORY:  The patient is currently followed by Dr. Truett Perna  at the cancer center and has been previously seen by Dr. Claud Kelp,  M.D., her general surgeon, for her colostomy.  Medical problems are post  colon cancer with colostomy in place.  She currently also has a Port-A-  Cath in her chest which requires flushing every six weeks.  She has a  history of dyslipidemia.   MEDICATIONS:  1. Klor-Con 20 mEq daily.  2. Pamelor 70 mg p.o. daily at night for depression.  3. Haldol 2 mg p.o. q.a.m.  4. Restoril 30 mg p.o. h.s. p.r.n. insomnia.  5. Flexeril 10 mg  t.i.d.  6. Valium 5 mg q.i.d.  7. Zantac 150 mg b.i.d.  8. Lipitor 20 mg daily.  9. Phenergan 25 mg q.8h. p.r.n. for nausea or vomiting.  10.Vicodin 5/500 t.i.d. p.r.n. pain.  11.Vistaril 25 mg q.i.d. p.r.n.  12.Prilosec 20 mg daily.   DRUG ALLERGIES:  No known drug allergies.   POSITIVE PHYSICAL FINDINGS:  The patient was medically cleared in the  emergency room.  Full review of systems and physical exam as noted  there.  This is an overweight white female who appears in no acute  distress, 5 feet 3 inches tall, 262 pounds, temperature 97.3, pulse 126  on admission, respirations 16, blood pressure 152/84.  Blood pressure  recheck was 143/85.  Physical exam in the emergency room did note some  crackles in the left lung.   DIAGNOSTIC STUDIES:  WBC 13.7, hemoglobin 14.4, hematocrit 43.4,  platelets 373,000.  Sodium 132, potassium 3.2, chloride 97, carbon  dioxide 27, BUN 3, creatinine 0.96, random glucose 157.  The patient's  calcium was 9.1.  Alcohol level less than 5.  Urine drug screen positive  for benzodiazepines.  Routine urinalysis revealed trace ketones but was  otherwise within normal limits.  She is afebrile.   MENTAL STATUS EXAM:  Fully alert female, pleasant, cooperative,  overweight, appropriately dressed, looks a little bit disheveled but  clear, coherent, pleasant to talk to, warms up quite a bit with  conversation, somewhat defensive about being here, asking to go home, is  embarrassed about her illness and the behaviors that precipitated her  being here.  Speech is normal in pace, tone and amount; no pressure,  articulate, fluent.  Well-spoken.  Thoughts are well organized.  Mood  slightly irritable; may be a little depressed.  Thought process is  coherent and goal directed.  She has been organizing things today, has  called her husband and asked him to bring her some supplies that she needs while here.  Has been appropriate in group and with others,  catching up  on some sleep.  No signs of internal distractions.  She  denies any subjective side effects from her medicines, denies any signs  or symptoms of EPS, and quick neuro exam here today does not reveal any  symptoms.  No rigidity.  No cogwheeling.  No signs of EPS.  Cognition is  intact.  Impulse control and judgment within normal limits.  Insight  adequate.  No suicidal or homicidal thought.  Concentration and  calculation and registration all within normal limits.  AXIS I:  Schizophrenia, NOS, chronic with acute exacerbation.  AXIS II:  No diagnosis.  AXIS III:  Leukocytosis, NOS; colon cancer with colostomy; Port-A-Cath  in place; history of dyslipidemia.  AXIS IV:  Is deferred.  AXIS V:  Current 30, past year 65.   PLAN:  To involuntarily admit the patient with every-15-minute checks in  place with a goal of alleviating her psychosis and stabilizing her.  We  are going to restart the Haldol 2 mg p.o. q.a.m. and the Valium 5 mg  four times a day.  We are going to continue all of her other routine  medications, talk to her family, see how they feel about having her home  and how stable things have been there and any concerns that they may  have.  The patient has  agreed to this.  We will check a pulse ox on her and keep an eye on her  white count here and make a decision on if we need to send her for a  chest x-ray.  She is denying any subjective symptoms.  No cough and no  off history of fever or chills.  Estimated length of stay is 3-5 days.      Margaret A. Lorin Picket, N.P.      Jasmine Pang, M.D.  Electronically Signed    MAS/MEDQ  D:  07/27/2006  T:  07/27/2006  Job:  [id number]

## 2010-11-12 NOTE — Discharge Summary (Signed)
NAME:  Diane Schroeder, Diane Schroeder                  ACCOUNT NO.:  [id number]   MEDICAL RECORD NO.:  [id number]          PATIENT TYPE:  IPS   LOCATION:  0400                          FACILITY:  [facility]   PHYSICIAN:  Jasmine Pang, M.D. DATE OF BIRTH:  [birth year]   DATE OF ADMISSION:  07/26/2006  DATE OF DISCHARGE:  07/31/2006                               DISCHARGE SUMMARY   IDENTIFYING INFORMATION:  This is a 65 year old married white female who  was admitted on an involuntary basis.   HISTORY OF PRESENT ILLNESS:  Patient is a 41 year old mother of two who  was petitioned by her family on the day before admission after she  became somewhat aggressive with her daughter.  She reported she believed  her daughter was trying to harm her.  The patient reports that she was  diagnosed as schizophrenia 30 years ago.  She stopped taking all of her  medications about 1 week ago, thinking hopefully that maybe she did not  need them anymore.  After about 5 days she began to decompensate and  experience auditory hallucinations similar to past hallucinations.  She  began to get paranoid, slightly agitated and became aggressive with her  daughter.  On the day of admission she was fully alert, pleasant,  cooperative, relaxed, focused, and had no signs of internal distraction.  She regrets stopping her medications.  She denies she was having any  particular side effects but just thought she would do okay with out the  meds.  She denies any mood lability, mania or suicidal or homicidal  thought.  The patient is currently followed as an outpatient at [facility] by Dr. Jules Schick. Her counselor is Abel Presto.  This is the second admission to [facility].  Patient  was here briefly for 1 day earlier this month, but was transferred to  the medical unit because of some medical complications.  She reports  being diagnosed with schizophrenia approximately 30 years ago.  Her in  her younger  years, she states she began having auditory hallucinations.  She denies any family history of auditory hallucinations.  She denies  any history of mood lability, mania or substance abuse.  Since that time  she has been quite stable over the years on Haldol with various  benzodiazepines.  She  has been hospitalized in the past.  She has no  known history of suicide attempts or history of violence.  Patient has a  35-year use of tobacco, two packs per day.  She stopped using tobacco  about 2 years ago.  She is a nondrinker.  She uses no substances.  She  is currently followed by Dr. Elnita Maxwell at [facility], and has been  previously seen by Dr. Claud Kelp, her general surgeon, for her  colostomy.   MEDICAL PROBLEMS:  Status post colon cancer with colostomy in place.  She currently has a Port-A-Cath in her chest which requires flushing  every 6 weeks.  She has a history of dyslipidemia.   PSYCHIATRIC MEDICATIONS:  1. Pamelor 70 mg p.o. nightly  for depression.  2. Haldol 2 mg p.o. q.a.m.  3. Restoril 30 mg p.o. nightly p.r.n. insomnia.  4. Valium 5 mg p.o. q.i.d.  5. Vistaril 25 mg q.i.d. p.r.n. anxiety.   ALLERGIES:  SHE HAS NO KNOWN DRUG ALLERGIES.   PHYSICAL FINDINGS AND DIAGNOSTIC STUDIES:  The patient was medically  cleared in the emergency room.  Full review of systems and physical exam  are noted there.  The physical exam in the emergency room did note some  crackles in her left lung.   DIAGNOSTIC STUDIES:  Revealed a WBC of 13.7, hemoglobin 14.4, hematocrit  of 43.4, platelets 373,000.  Basic metabolic panel revealed sodium 132,  potassium was 3.2, chloride 97, CO2 27, BUN 3, creatinine 0.96, random  glucose 157.  The patient's calcium was 9.1.  Alcohol level was less  than 5.  Urine drug screen was positive for benzodiazepines (patient  takes Valium).  Routine urinalysis revealed trace ketones but was  otherwise within normal limits.  She was afebrile.   HOSPITAL  COURSE:  Upon admission, patient was started on Klor-Con 20 mEq  p.o. daily, Pamelor 75 mg p.o. daily,  Haldol 3 mg p.o. daily, Restoril  30 mg p.o. nightly p.r.n. insomnia, Flexeril 10 mg p.o. t.i.d., Valium 5  mg p.o. q.i.d., Zantac 150 mg p.o. b.i.d., Lipitor 20 mg p.o. daily,  Phenergan 25 mg p.o. q.8 hours p.r.n. nausea, Vicodin 5/500 p.o. t.i.d.,  Vistaril 25 mg p.o. q.i.d. p.r.n. anxiety, Prilosec 20 mg p.o. daily.  On July 27, 2006, the Haldol was changed from 3 mg q.a.m. to 2 mg  q.a.m. She was also placed on Haldol 1 mg q.6 hour p.r.n.  hallucinations.  The patient tolerated her medications well with no  significant side effects.  She essentially was restarted on all of her  medications and responded well fairly quickly.   Upon first meeting patient on July 27, 2006, she states she had quit  taking her medications.  She thought her husband had her daughter were  trying to harm her.  Her affect was constricted.  Mood was depressed.  Speech soft and slow.  Poor eye contact.  Positive psychomotor  retardation.  No suicidal or homicidal ideation.  No auditory or visual  hallucinations.  She stated she had not slept in the past 2 days and was  sleeping in her room.  On July 28, 2006, patient was very cooperative  and talkative with me with good eye contact.  She again discussed her  noncompliance with her medications and her resolve to stay on them now  she talked about her husband and how supportive he is.  On July 29, 2006, patient slept well.  She was acting appropriately and was  cooperative with her meds.  She continued to be improving and hoped for  a family visit today.  A family session was held, and the patient  discussed ways to help her keep track of her medications.  Her husband  was very supportive and indicated he would help her as much as he could. She identified going off her medications as the main reason for  admittance to [facility].   On July 30, 2006, patient was lying in bed with psychomotor  retardation.  Eye contact was good.  Speech was soft and slow.  She  states she felt better being back on her meds.  She planned to be  compliant at home.  Her family noticed that she is better.  She was  hoping for a discharge within the next few days.  On July 31 2006,  mental status had improved markedly from admission.  Patient was  friendly, talkative, cooperative.  She had good eye contact.  Speech  normal rate and flow.  Psychomotor activity was within normal limits.  Mood was euthymic.  Affect wide range.  There was no suicidal or  homicidal ideation.  No thoughts of self-injurious behavior.  No  auditory or visual hallucinations.  No paranoia or delusions.  Thoughts  logical and goal-directed.  Thought content no predominant theme.  Cognitive was grossly within normal limits.  It was felt patient was  stable to be discharged today.   DISCHARGE DIAGNOSES:  AXIS I:  Schizophrenia, undifferentiated type.  AXIS II:  No diagnosis.  AXIS III:  Leukocytosis not otherwise specified.  Colon cancer with  colostomy.  Port-A-Cath in place.  History of dyslipidemia.  AXIS IV:  Moderate (burden of psychiatric illness, medical problems).  AXIS V:  Global assessment of functioning upon discharge 50.  Global  assessment of functioning upon admission was 30.  Global assessment of  functioning highest past year 75.   DISCHARGE PLANS:  There were no specific activity level or dietary  restrictions.   DISCHARGE MEDICATIONS:  1. Pamelor  75 mg daily.  2. Haldol 2 mg daily.  3. Flexeril 10 mg 3 times daily.  4. Prilosec 20 mg p.o. daily.  5. Valium 5 mg q.i.d.  6. Lipitor 420 mg daily.  7. K-Dur  20 mEq daily.  8. Restoril 30 mg at bedtime p.r.n. insomnia.  9. Phenergan 25 mg every 8 hours as needed p.r.n. and nausea.  10.Vicodin 5/500 every 8 hours as needed p.r.n. pain.  11.Vistaril 25 mg 1 p.o. q.i.d. p.r.n.  anxiety.  12.Zantac 150 mg p.o. daily.   POST HOSPITAL CARE PLANS:  Patient will return to [facility] to see Dr. Jules Schick on Tuesday February 5 at 9:00 a.m.      Jasmine Pang, M.D.  Electronically Signed     BHS/MEDQ  D:  07/31/2006  T:  07/31/2006  Job:  [id number]

## 2010-11-12 NOTE — H&P (Signed)
NAME:  Diane Schroeder, Diane Schroeder                  ACCOUNT NO.:  [id number]   MEDICAL RECORD NO.:  [id number]          PATIENT TYPE:  INP   LOCATION:  1322                         FACILITY:  [facility]   PHYSICIAN:  Hind I Elsaid, MD      DATE OF BIRTH:  03/16/1946   DATE OF ADMISSION:  07/02/2006  DATE OF DISCHARGE:                              HISTORY & PHYSICAL   PRIMARY CARE PHYSICIAN:  Unassigned.   REASON FOR VISIT:  Transferred from [facility] because of  acute onset of altered mental status and hypoxia in the emergency room  of [facility].   HISTORY OF PRESENT ILLNESS:  This 65 year old female with history of  paranoid schizophrenia has been admitted because of the patient's  agitation in the emergency room and she is sleeping at this time.  Actually called the husband to get more history from him where he was  admitted.  His wife has a history of paranoid schizophrenia from a year  ago, and she was on some medication for schizophrenia.  The patient  stopped taking the schizophrenic medication, and for two months she  started having paranoid behavior and schizophrenia but which is  progressively getting worse for two days where she started talking to  herself when people are not around her, and it reached to the point that  the patient had to go on he street and get some other people's car and  says that it is her car, and he had to restrain her to bring her to the  hospital.  Also, she has auditory and visual hallucinations.  The  patient was in the [facility] yesterday and was admitted to  [facility] for further evaluation for the paranoid behavior, but  the patient continued to be agitated and restless.  So, she was brought  to the emergency room for evaluation of acute altered status, and  according to the ED attending, she was with complaint of dyspnea.  It  was noted that the patient was catatonic until 2 p.m.  Once aroused, the  patient became increasingly  psychotic, aggressive and fighting.  At that  time, it was noted that oxygen saturation was 70-80% on 2 liters of  oxygen, and they were able to put oxygen to 95% on 4 liters and then now  she is on 95% on room air.  The patient was to receive Ativan, Haldol  and Coumadin as her oxygen saturation was recorded.   PAST MEDICAL HISTORY:  1. Significant for history of hypertension.  2. History of colon cancer status post colectomy with colostomy bag.  3. History of paranoid schizophrenia with noncompliance with      medication.  4. History of chronic depression.  5. History of anxiety, questionable bipolar disorder, panic attacks,      gastroesophageal reflux disease.  6. Osteoporosis.  7. Former history of tobacco abuse.   PAST SURGICAL HISTORY:  1. History of sigmoid colectomy with colostomy and resection of the      tip of the cecum.  2. Appendectomy.  3. End descending colostomy  by Dr. Derrell Lolling in April 2005.  4. Status post endoscopy, Dr. Virginia Rochester on September 25, 2003.  5. Status post colonoscopy on March 25 by Dr. Virginia Rochester.  6. History of hysterectomy from what she described as cancer at [facility].  7. Status post breast lumpectomy by Dr. Derrell Lolling, benign per patient      report and old records.   ALLERGIES:  No known drug allergies.   MEDICATIONS:  As brought from [facility]:  1. Nortriptyline 25 mg p.o. anxiety.  2. Haldol 1 mg p.o. b.i.d.  3. Temazepam 30 mg p.o. h.s. p.r.n.  4. Cyclobenzaprine 10 mg p.o. t.i.d.  5. Diazepam 5 mg p.o. q.i.d.  6. Ranitidine 150 mg p.o. b.i.d.  7. Lipitor 20 mg p.o. daily.  8. Promethazine 25 mg p.o. q.8 h p.r.n.  9. Hydrocodone/AP 5/500 p.o. t.i.d. p.r.n.  10.Omeprazole 20 mg p.o. daily.  11.Hydroxyzine 25 mg p.o. q.i.d. p.r.n.  12.Torsemide 100 mg p.o. daily p.r.n. edema.   REVIEW OF SYSTEMS:  When the patient calmed down, kind of asking the  patient, but she is unable to give any history.  She denies any history  of chest pain,  shortness of breath, abdominal pain, nausea and vomiting,  change in bowel habits, headache, change of her lower extremities,  swelling or edema.   FAMILY HISTORY:  Her mother died of CVA complications.  She had a  history of diabetes mellitus.  Father died at 66 of old age.  One sister  alive with history of kidney cancer.  One brother deceased with prostate  cancer.  No history of cancer of the colon or lymphoma.   SOCIAL HISTORY:  The patient is married, has two grown children.  She is  on disability secondary to her psychological disorder.  No history of  alcohol intake.  She quite 6 years ago the use of two packs a day of  tobacco for 35 years.  Her caretaker is her husband.   PHYSICAL EXAMINATION:  GENERAL:  Obese lady, white female, not in any  acute distress.  She is alert and oriented x3.  She is reluctant to give  history, and not in acute respiratory distress or in pain.  VITAL SIGNS:  In the emergency room, temperature 98.2, pulse rate 90-  116, sinus tachycardia with sinus rate 20-24, blood pressure 145/76.  She is saturating 94% on room air with episode of oxygen saturation 70-  80% on 2 liters.  HEENT:  Normocephalic, atraumatic.  Pupils equal and reactive.  Extraocular muscles can not be assessed, but it looks normal.  NECK:  Short with no JVD and no thyromegaly.  BREAST:  Normal.  LUNGS:  Normal vesicular breathing with equal air entry.  HEART:  S1, S, sinus tachycardia.  ABDOMEN:  Distended. Colostomy tube with no fecal in it, and looked was  healed.  Bowel sounds positive.  EXTREMITIES:  No lower edema.  No cyanosis.  Peripheral pulses are  intact.  NEUROLOGICAL:  The patient is oriented x3.  Follows commands.  Cranial  nerves II-XII intact.  In her lower extremities, she can move all her  extremities, and there is no evidence of sensory loss.  SKIN:  No ecchymosis, and no evidence of a skin rash.  LABORATORY DATA:  Urine microscopy was 0-2 and urinalysis was  negative  for nitrates or leukocyte esterase and then some large blood on it.  Cardiac markers:  Troponin less than 0.05 and myoglobin more than  500.  D. dimers are 0.32.  CMP:  Sodium 138, potassium 4, chloride 97,  bicarbonate 30, glucose 145, BUN 8, creatinine 1.1, alk-phos 178,  bilirubin 0.7, AST 26, ALT 15, total protein 7.6, albumin 35, calcium  9.8.  CBC with leukocytes 19.3, hemoglobin 14.2, hematocrit 42,  platelets 379.  TSH 2.4-3.  LFTs as mentioned.  Drug screen is positive  for benzodiazepine.  Alcohol was negative.  Chest x-ray was done which  revealed no focal infiltrate or effusion was seen, and repeat film  showed no active process.   ASSESSMENT/PLAN:  1. Altered mental status with history of colon cancer.  CT scan of the      head showed there was no acute finding.  Most probably has altered      mental status due to the paranoid schizophrenia.  We cannot rule      out medical cause.  Will go ahead with the TSH level, folate,      vitamin B12, VDRL.  If the patient spikes fever or continues to be      combative, may go ahead and do LP to rule out      encephalitis/meningitis, but my opinion is this is probably due to      paranoid schizophrenia.  Will get psych consult in the morning.      Will continue with Haldol and Ativan at this time.  2. Hypoxia which could be due to when the patient was catatonic, with      the obesity, but we cannot rule out pulmonary embolism.  Will go      ahead with CT of the chest with IV contrast to rule out pulmonary      embolism.  Will get a CK level and myoglobin,  3. High myoglobin level.  It could be from the catatonic state where      the patient was.  Will continue with her IV fluid.  4. Colon cancer status post colostomy.  Oncology as out patient.  5. History of dyslipidemia.  Will continue with Lipitor.  6. Gastroesophageal reflux disease.  Will continue with Protonix.  7. Deep venous thrombosis and gastrointestinal  prophylaxis.      Hind Bosie Helper, MD  Electronically Signed     HIE/MEDQ  D:  07/03/2006  T:  07/03/2006  Job:  [id number]

## 2010-12-20 ENCOUNTER — Encounter (HOSPITAL_BASED_OUTPATIENT_CLINIC_OR_DEPARTMENT_OTHER): Payer: Medicare Other | Admitting: Oncology

## 2010-12-20 DIAGNOSIS — Z85038 Personal history of other malignant neoplasm of large intestine: Secondary | ICD-10-CM

## 2010-12-20 DIAGNOSIS — Z452 Encounter for adjustment and management of vascular access device: Secondary | ICD-10-CM

## 2011-01-31 ENCOUNTER — Encounter (HOSPITAL_BASED_OUTPATIENT_CLINIC_OR_DEPARTMENT_OTHER): Payer: Medicare Other | Admitting: Oncology

## 2011-01-31 DIAGNOSIS — Z452 Encounter for adjustment and management of vascular access device: Secondary | ICD-10-CM

## 2011-01-31 DIAGNOSIS — Z85038 Personal history of other malignant neoplasm of large intestine: Secondary | ICD-10-CM

## 2011-03-17 ENCOUNTER — Encounter (HOSPITAL_BASED_OUTPATIENT_CLINIC_OR_DEPARTMENT_OTHER): Payer: Medicare Other | Admitting: Oncology

## 2011-03-17 DIAGNOSIS — Z85038 Personal history of other malignant neoplasm of large intestine: Secondary | ICD-10-CM

## 2011-03-17 DIAGNOSIS — Z452 Encounter for adjustment and management of vascular access device: Secondary | ICD-10-CM

## 2011-04-04 ENCOUNTER — Encounter (HOSPITAL_BASED_OUTPATIENT_CLINIC_OR_DEPARTMENT_OTHER): Payer: Medicare Other | Admitting: Oncology

## 2011-04-04 ENCOUNTER — Other Ambulatory Visit: Payer: Self-pay | Admitting: Oncology

## 2011-04-04 DIAGNOSIS — Z85038 Personal history of other malignant neoplasm of large intestine: Secondary | ICD-10-CM

## 2011-04-04 DIAGNOSIS — Z452 Encounter for adjustment and management of vascular access device: Secondary | ICD-10-CM

## 2011-04-04 LAB — CEA: CEA: <0.5 ng/mL (ref 0.0–5.0)

## 2011-05-26 ENCOUNTER — Telehealth: Payer: Self-pay | Admitting: Oncology

## 2011-05-26 ENCOUNTER — Ambulatory Visit (HOSPITAL_BASED_OUTPATIENT_CLINIC_OR_DEPARTMENT_OTHER): Payer: Medicare Other

## 2011-05-26 DIAGNOSIS — Z452 Encounter for adjustment and management of vascular access device: Secondary | ICD-10-CM

## 2011-05-26 DIAGNOSIS — C189 Malignant neoplasm of colon, unspecified: Secondary | ICD-10-CM

## 2011-05-26 MED ORDER — HEPARIN SOD (PORK) LOCK FLUSH 100 UNIT/ML IV SOLN
500.0000 [IU] | Freq: Once | INTRAVENOUS | Status: AC
  Administered 2011-05-26: 500 [IU] via INTRAVENOUS
  Filled 2011-05-26: qty 5

## 2011-05-26 MED ORDER — SODIUM CHLORIDE 0.9 % IJ SOLN
10.0000 mL | INTRAMUSCULAR | Status: DC | PRN
  Administered 2011-05-26: 10 mL via INTRAVENOUS
  Filled 2011-05-26: qty 10

## 2011-05-26 NOTE — Telephone Encounter (Signed)
gve the pt her jan-April 2013 appt calendars

## 2011-07-07 ENCOUNTER — Ambulatory Visit (HOSPITAL_BASED_OUTPATIENT_CLINIC_OR_DEPARTMENT_OTHER): Payer: Medicare Other

## 2011-07-07 DIAGNOSIS — C189 Malignant neoplasm of colon, unspecified: Secondary | ICD-10-CM

## 2011-07-07 MED ORDER — SODIUM CHLORIDE 0.9 % IJ SOLN
10.0000 mL | INTRAMUSCULAR | Status: DC | PRN
  Administered 2011-07-07: 10 mL via INTRAVENOUS
  Filled 2011-07-07: qty 10

## 2011-07-07 MED ORDER — HEPARIN SOD (PORK) LOCK FLUSH 100 UNIT/ML IV SOLN
500.0000 [IU] | Freq: Once | INTRAVENOUS | Status: AC
  Administered 2011-07-07: 500 [IU] via INTRAVENOUS
  Filled 2011-07-07: qty 5

## 2011-08-18 ENCOUNTER — Ambulatory Visit (HOSPITAL_BASED_OUTPATIENT_CLINIC_OR_DEPARTMENT_OTHER): Payer: Medicare Other

## 2011-08-18 DIAGNOSIS — Z469 Encounter for fitting and adjustment of unspecified device: Secondary | ICD-10-CM

## 2011-08-18 DIAGNOSIS — C189 Malignant neoplasm of colon, unspecified: Secondary | ICD-10-CM

## 2011-08-18 MED ORDER — SODIUM CHLORIDE 0.9 % IJ SOLN
10.0000 mL | INTRAMUSCULAR | Status: DC | PRN
Start: 2011-08-18 — End: 2011-08-18
  Administered 2011-08-18: 10 mL via INTRAVENOUS
  Filled 2011-08-18: qty 10

## 2011-08-18 MED ORDER — HEPARIN SOD (PORK) LOCK FLUSH 100 UNIT/ML IV SOLN
500.0000 [IU] | Freq: Once | INTRAVENOUS | Status: AC
  Administered 2011-08-18: 500 [IU] via INTRAVENOUS
  Filled 2011-08-18: qty 5

## 2011-09-20 ENCOUNTER — Other Ambulatory Visit: Payer: Self-pay | Admitting: Oncology

## 2011-09-20 DIAGNOSIS — Z85038 Personal history of other malignant neoplasm of large intestine: Secondary | ICD-10-CM

## 2011-10-06 ENCOUNTER — Ambulatory Visit (HOSPITAL_BASED_OUTPATIENT_CLINIC_OR_DEPARTMENT_OTHER): Payer: Medicare Other | Admitting: Oncology

## 2011-10-06 ENCOUNTER — Telehealth: Payer: Self-pay | Admitting: Oncology

## 2011-10-06 VITALS — BP 165/89 | HR 102 | Temp 98.1°F | Ht 63.0 in | Wt 255.9 lb

## 2011-10-06 DIAGNOSIS — C189 Malignant neoplasm of colon, unspecified: Secondary | ICD-10-CM

## 2011-10-06 NOTE — Progress Notes (Signed)
OFFICE PROGRESS NOTE   INTERVAL HISTORY:   She returns as scheduled. She feels well in general. She continues to have intermittent back pain with activity and an occasional pain in the right lower abdomen. The colostomy is functioning well. Good appetite. Mild exertional dyspnea. No problem with the Port-A-Cath.  Objective:  Vital signs in last 24 hours:  Blood pressure 165/89, pulse 102, temperature 98.1 F (36.7 C), temperature source Oral, height 5\' 3"  (1.6 m), weight 255 lb 14.4 oz (116.075 kg).    HEENT: Neck without mass Lymphatics: No cervical, supraclavicular, axillary, or inguinal lymph nodes Resp: Lungs clear bilaterally Cardio: Regular rate and rhythm GI: No hepatomegaly, no mass, nontender, left lower quadrant colostomy with a surrounding hernia Vascular: No leg edema    Portacath/PICC-without erythema  Lab Results:  CEA less than 0.5 on 04/04/2011   Medications: I have reviewed the patient's current medications.  Assessment/Plan: 1. Locally advanced colon cancer diagnosed in April of 2005, she remains in clinical remission. 2. Indwelling Port-A-Cath.  The Port-A-Cath is flushed every 6 weeks.  We again discussed removing the Port-A-Cath.  Diane Schroeder would like to leave the Port-A-Cath in place. 3. Chronic back pain. 4. Weight loss following the death of her husband, resolved. 5. Left lower quadrant parastomal hernia. 6. Psoriasis. 7. Chronic Yeast rash in the groin.   Disposition:  She remains in clinical remission from the colon cancer. She declines removal of the Port-A-Cath. She also declines a surveillance colonoscopy. She does not wish to consider reversal of the colostomy.  Ms. Bullard will return for a Port-A-Cath flush every 6 weeks. She is scheduled for a 6 month office visit. The CEA will be checked in 6 weeks.   Lucile Shutters, MD  10/06/2011  5:59 PM

## 2011-10-06 NOTE — Telephone Encounter (Signed)
Gv pt appt for [id number]-oct2013

## 2011-11-17 ENCOUNTER — Other Ambulatory Visit: Payer: Medicare Other | Admitting: Lab

## 2011-11-17 ENCOUNTER — Ambulatory Visit (HOSPITAL_BASED_OUTPATIENT_CLINIC_OR_DEPARTMENT_OTHER): Payer: Medicare Other

## 2011-11-17 VITALS — BP 177/90 | HR 106 | Temp 98.4°F

## 2011-11-17 DIAGNOSIS — Z452 Encounter for adjustment and management of vascular access device: Secondary | ICD-10-CM

## 2011-11-17 DIAGNOSIS — C189 Malignant neoplasm of colon, unspecified: Secondary | ICD-10-CM

## 2011-11-17 LAB — CEA: CEA: <0.5 ng/mL (ref 0.0–5.0)

## 2011-11-17 MED ORDER — HEPARIN SOD (PORK) LOCK FLUSH 100 UNIT/ML IV SOLN
500.0000 [IU] | Freq: Once | INTRAVENOUS | Status: AC
  Administered 2011-11-17: 500 [IU] via INTRAVENOUS
  Filled 2011-11-17: qty 5

## 2011-11-17 MED ORDER — SODIUM CHLORIDE 0.9 % IJ SOLN
10.0000 mL | INTRAMUSCULAR | Status: DC | PRN
  Administered 2011-11-17: 10 mL via INTRAVENOUS
  Filled 2011-11-17: qty 10

## 2011-11-17 NOTE — Patient Instructions (Signed)
Call MD for problems 

## 2012-01-05 ENCOUNTER — Ambulatory Visit (HOSPITAL_BASED_OUTPATIENT_CLINIC_OR_DEPARTMENT_OTHER): Payer: Medicare Other

## 2012-01-05 VITALS — BP 173/77 | HR 91 | Temp 97.7°F

## 2012-01-05 DIAGNOSIS — Z452 Encounter for adjustment and management of vascular access device: Secondary | ICD-10-CM

## 2012-01-05 DIAGNOSIS — C189 Malignant neoplasm of colon, unspecified: Secondary | ICD-10-CM

## 2012-01-05 MED ORDER — HEPARIN SOD (PORK) LOCK FLUSH 100 UNIT/ML IV SOLN
500.0000 [IU] | Freq: Once | INTRAVENOUS | Status: AC
  Administered 2012-01-05: 500 [IU] via INTRAVENOUS
  Filled 2012-01-05: qty 5

## 2012-01-05 MED ORDER — SODIUM CHLORIDE 0.9 % IJ SOLN
10.0000 mL | INTRAMUSCULAR | Status: DC | PRN
  Administered 2012-01-05: 10 mL via INTRAVENOUS
  Filled 2012-01-05: qty 10

## 2012-02-16 ENCOUNTER — Ambulatory Visit (HOSPITAL_BASED_OUTPATIENT_CLINIC_OR_DEPARTMENT_OTHER): Payer: Medicare Other

## 2012-02-16 VITALS — BP 165/81 | HR 103 | Temp 97.9°F

## 2012-02-16 DIAGNOSIS — Z452 Encounter for adjustment and management of vascular access device: Secondary | ICD-10-CM

## 2012-02-16 DIAGNOSIS — C189 Malignant neoplasm of colon, unspecified: Secondary | ICD-10-CM

## 2012-02-16 MED ORDER — HEPARIN SOD (PORK) LOCK FLUSH 100 UNIT/ML IV SOLN
500.0000 [IU] | Freq: Once | INTRAVENOUS | Status: AC
  Administered 2012-02-16: 500 [IU] via INTRAVENOUS
  Filled 2012-02-16: qty 5

## 2012-02-16 MED ORDER — SODIUM CHLORIDE 0.9 % IJ SOLN
10.0000 mL | INTRAMUSCULAR | Status: DC | PRN
  Administered 2012-02-16: 10 mL via INTRAVENOUS
  Filled 2012-02-16: qty 10

## 2012-02-16 NOTE — Patient Instructions (Signed)
Call MD for any problems 

## 2012-03-29 ENCOUNTER — Ambulatory Visit (HOSPITAL_BASED_OUTPATIENT_CLINIC_OR_DEPARTMENT_OTHER): Payer: Medicare Other | Admitting: Oncology

## 2012-03-29 ENCOUNTER — Ambulatory Visit: Payer: Medicare Other

## 2012-03-29 ENCOUNTER — Telehealth: Payer: Self-pay | Admitting: Oncology

## 2012-03-29 VITALS — Ht 63.0 in | Wt 258.1 lb

## 2012-03-29 VITALS — BP 188/97 | HR 117 | Temp 96.7°F

## 2012-03-29 DIAGNOSIS — C189 Malignant neoplasm of colon, unspecified: Secondary | ICD-10-CM

## 2012-03-29 DIAGNOSIS — R1031 Right lower quadrant pain: Secondary | ICD-10-CM

## 2012-03-29 DIAGNOSIS — I10 Essential (primary) hypertension: Secondary | ICD-10-CM

## 2012-03-29 DIAGNOSIS — C19 Malignant neoplasm of rectosigmoid junction: Secondary | ICD-10-CM

## 2012-03-29 MED ORDER — HEPARIN SOD (PORK) LOCK FLUSH 100 UNIT/ML IV SOLN
500.0000 [IU] | Freq: Once | INTRAVENOUS | Status: AC
  Administered 2012-03-29: 500 [IU] via INTRAVENOUS
  Filled 2012-03-29: qty 5

## 2012-03-29 MED ORDER — SODIUM CHLORIDE 0.9 % IJ SOLN
10.0000 mL | INTRAMUSCULAR | Status: DC | PRN
  Administered 2012-03-29: 10 mL via INTRAVENOUS
  Filled 2012-03-29: qty 10

## 2012-03-29 NOTE — Progress Notes (Signed)
   [facility]    OFFICE PROGRESS NOTE   INTERVAL HISTORY:   She returns as scheduled. She reports a good appetite. Stable intermittent back pain. She also has pain intermittently in the right lower abdomen. No consistent pain. No other complaint.  Objective:  Vital signs in last 24 hours:  Height 5\' 3"  (1.6 m), weight 258 lb 1.6 oz (117.073 kg). blood pressure 188/97    HEENT: Neck without mass Lymphatics: No cervical, supraclavicular, axillary, or inguinal nodes Resp: Lungs clear bilaterally Cardio: Regular rate and rhythm GI: No hepatosplenomegaly, left lower quadrant colostomy, no mass Vascular: No leg edema Skin: Yeast rash in the right greater than left groin  Portacath/PICC-without erythema  Lab Results:  CEA on 11/17/2011-less than 0.5  Medications: I have reviewed the patient's current medications.  Assessment/Plan: 1. Locally advanced colon cancer diagnosed in April of 2005, she remains in clinical remission. 2. Indwelling Port-A-Cath. The Port-A-Cath is flushed every 6 weeks. We again discussed removing the Port-A-Cath. Ms. Busler would like to leave the Port-A-Cath in place. 3. Chronic back pain. 4. Weight loss following the death of her husband, resolved. 5. Left lower quadrant parastomal hernia. 6. Psoriasis. 7. Chronic Yeast rash in the groin. 8. Hypertension-I. recommended she schedule an appointment with Dr. Juleen China to evaluate the hypertension  Disposition:  Ms. Goodloe remains in clinical remission from colon cancer. I recommended Port-A-Cath removal and she continues to decline this. She understands the risks for infection and thrombosis. She does not wish to consider a colostomy reversal procedure.  She will return for a Port-A-Cath flush every 6 weeks. Ms. Lofton is scheduled for an office visit in 6 months.   Thornton Papas, MD  03/29/2012  4:26 PM

## 2012-03-29 NOTE — Telephone Encounter (Signed)
appts made and printed for pt aom °

## 2012-05-10 ENCOUNTER — Ambulatory Visit (HOSPITAL_BASED_OUTPATIENT_CLINIC_OR_DEPARTMENT_OTHER): Payer: Medicare Other

## 2012-05-10 VITALS — BP 182/78 | HR 114 | Temp 98.3°F

## 2012-05-10 DIAGNOSIS — Z452 Encounter for adjustment and management of vascular access device: Secondary | ICD-10-CM

## 2012-05-10 DIAGNOSIS — C189 Malignant neoplasm of colon, unspecified: Secondary | ICD-10-CM

## 2012-05-10 MED ORDER — SODIUM CHLORIDE 0.9 % IJ SOLN
10.0000 mL | INTRAMUSCULAR | Status: DC | PRN
  Administered 2012-05-10: 10 mL via INTRAVENOUS
  Filled 2012-05-10: qty 10

## 2012-05-10 MED ORDER — HEPARIN SOD (PORK) LOCK FLUSH 100 UNIT/ML IV SOLN
500.0000 [IU] | Freq: Once | INTRAVENOUS | Status: AC
  Administered 2012-05-10: 500 [IU] via INTRAVENOUS
  Filled 2012-05-10: qty 5

## 2012-06-19 ENCOUNTER — Ambulatory Visit (HOSPITAL_BASED_OUTPATIENT_CLINIC_OR_DEPARTMENT_OTHER): Payer: Medicare Other

## 2012-06-19 VITALS — BP 180/86 | HR 102 | Temp 97.9°F | Resp 18

## 2012-06-19 DIAGNOSIS — Z452 Encounter for adjustment and management of vascular access device: Secondary | ICD-10-CM

## 2012-06-19 DIAGNOSIS — C189 Malignant neoplasm of colon, unspecified: Secondary | ICD-10-CM

## 2012-06-19 MED ORDER — HEPARIN SOD (PORK) LOCK FLUSH 100 UNIT/ML IV SOLN
500.0000 [IU] | Freq: Once | INTRAVENOUS | Status: AC
  Administered 2012-06-19: 500 [IU] via INTRAVENOUS
  Filled 2012-06-19: qty 5

## 2012-06-19 MED ORDER — SODIUM CHLORIDE 0.9 % IJ SOLN
10.0000 mL | INTRAMUSCULAR | Status: DC | PRN
  Administered 2012-06-19: 10 mL via INTRAVENOUS
  Filled 2012-06-19: qty 10

## 2012-08-02 ENCOUNTER — Ambulatory Visit (HOSPITAL_BASED_OUTPATIENT_CLINIC_OR_DEPARTMENT_OTHER): Payer: Medicare Other

## 2012-08-02 VITALS — BP 159/75 | HR 112 | Temp 97.7°F

## 2012-08-02 DIAGNOSIS — C189 Malignant neoplasm of colon, unspecified: Secondary | ICD-10-CM

## 2012-08-02 DIAGNOSIS — Z452 Encounter for adjustment and management of vascular access device: Secondary | ICD-10-CM

## 2012-08-02 MED ORDER — HEPARIN SOD (PORK) LOCK FLUSH 100 UNIT/ML IV SOLN
500.0000 [IU] | Freq: Once | INTRAVENOUS | Status: AC
  Administered 2012-08-02: 500 [IU] via INTRAVENOUS
  Filled 2012-08-02: qty 5

## 2012-08-02 MED ORDER — SODIUM CHLORIDE 0.9 % IJ SOLN
10.0000 mL | INTRAMUSCULAR | Status: DC | PRN
  Administered 2012-08-02: 10 mL via INTRAVENOUS
  Filled 2012-08-02: qty 10

## 2012-08-30 ENCOUNTER — Telehealth: Payer: Self-pay | Admitting: Oncology

## 2012-08-30 NOTE — Telephone Encounter (Signed)
Gave pt appt for lab and Md , flush for April 2014 r/s from 09/13/12

## 2012-09-13 ENCOUNTER — Ambulatory Visit: Payer: Medicare Other | Admitting: Oncology

## 2012-09-13 ENCOUNTER — Other Ambulatory Visit: Payer: Medicare Other | Admitting: Lab

## 2012-09-17 ENCOUNTER — Ambulatory Visit (HOSPITAL_BASED_OUTPATIENT_CLINIC_OR_DEPARTMENT_OTHER): Payer: Medicare Other

## 2012-09-17 VITALS — BP 169/77 | HR 110 | Temp 97.7°F

## 2012-09-17 DIAGNOSIS — Z452 Encounter for adjustment and management of vascular access device: Secondary | ICD-10-CM

## 2012-09-17 DIAGNOSIS — C189 Malignant neoplasm of colon, unspecified: Secondary | ICD-10-CM

## 2012-09-17 MED ORDER — SODIUM CHLORIDE 0.9 % IJ SOLN
10.0000 mL | INTRAMUSCULAR | Status: DC | PRN
  Administered 2012-09-17: 10 mL via INTRAVENOUS
  Filled 2012-09-17: qty 10

## 2012-09-17 MED ORDER — HEPARIN SOD (PORK) LOCK FLUSH 100 UNIT/ML IV SOLN
500.0000 [IU] | Freq: Once | INTRAVENOUS | Status: AC
  Administered 2012-09-17: 500 [IU] via INTRAVENOUS
  Filled 2012-09-17: qty 5

## 2012-10-11 ENCOUNTER — Other Ambulatory Visit: Payer: Medicare Other | Admitting: Lab

## 2012-10-11 ENCOUNTER — Ambulatory Visit (HOSPITAL_BASED_OUTPATIENT_CLINIC_OR_DEPARTMENT_OTHER): Payer: Medicare Other | Admitting: Oncology

## 2012-10-11 ENCOUNTER — Other Ambulatory Visit: Payer: Self-pay | Admitting: *Deleted

## 2012-10-11 ENCOUNTER — Ambulatory Visit (HOSPITAL_BASED_OUTPATIENT_CLINIC_OR_DEPARTMENT_OTHER): Payer: Medicare Other

## 2012-10-11 ENCOUNTER — Telehealth: Payer: Self-pay | Admitting: Oncology

## 2012-10-11 VITALS — BP 156/68 | HR 132 | Temp 98.1°F | Resp 20 | Ht 63.0 in | Wt 252.1 lb

## 2012-10-11 DIAGNOSIS — Z85038 Personal history of other malignant neoplasm of large intestine: Secondary | ICD-10-CM

## 2012-10-11 DIAGNOSIS — C189 Malignant neoplasm of colon, unspecified: Secondary | ICD-10-CM

## 2012-10-11 LAB — CEA: CEA: <0.5 ng/mL (ref 0.0–5.0)

## 2012-10-11 MED ORDER — HEPARIN SOD (PORK) LOCK FLUSH 100 UNIT/ML IV SOLN
500.0000 [IU] | Freq: Once | INTRAVENOUS | Status: AC
  Administered 2012-10-11: 500 [IU] via INTRAVENOUS
  Filled 2012-10-11: qty 5

## 2012-10-11 MED ORDER — PROMETHAZINE HCL 25 MG PO TABS: 25.0000 mg | ORAL_TABLET | Freq: Four times a day (QID) | ORAL | Status: DC | PRN

## 2012-10-11 MED ORDER — SODIUM CHLORIDE 0.9 % IJ SOLN
10.0000 mL | INTRAMUSCULAR | Status: DC | PRN
  Administered 2012-10-11: 10 mL via INTRAVENOUS
  Filled 2012-10-11: qty 10

## 2012-10-11 MED ORDER — NYSTATIN 100000 UNIT/GM EX POWD: CUTANEOUS | Status: DC

## 2012-10-11 NOTE — Patient Instructions (Signed)
Call MD for problems or concerns 

## 2012-10-11 NOTE — Progress Notes (Signed)
   [facility]    OFFICE PROGRESS NOTE   INTERVAL HISTORY:   She returns as scheduled. No problem with the Port-A-Cath. She would like to keep the Port-A-Cath in place. She has intermittent diarrhea. Chronic and intermittent low back pain. She complains of a rash in the groin.  Objective:  Vital signs in last 24 hours:  Blood pressure 156/68, pulse 132, temperature 98.1 F (36.7 C), temperature source Oral, resp. rate 20, height 5\' 3"  (1.6 m), weight 252 lb 1.6 oz (114.352 kg).    HEENT: Neck without mass Lymphatics: No cervical, supra-clavicular, axillary, or inguinal nodes. Resp: Lungs clear bilaterally Cardio: Tachycardia with premature beats, the pulse is less than 130 on examination GI: No hepatomegaly, left lower quadrant colostomy, tubular soft fullness with associated tenderness in the right lower abdomen Vascular: No leg edema  Skin: Yeast rash in the right groin   Portacath/PICC-without erythema  Lab Results:  CEA pending   Medications: I have reviewed the patient's current medications.  Assessment/Plan: 1. Locally advanced colon cancer diagnosed in April of 2005, she remains in clinical remission. 2. Indwelling Port-A-Cath. The Port-A-Cath is flushed every 6 weeks. We again discussed removing the Port-A-Cath. Ms. Depaula would like to leave the Port-A-Cath in place. 3. Chronic back pain. 4. Weight loss following the death of her husband, resolved. 5. Left lower quadrant parastomal hernia. 6. Psoriasis. 7. Chronic Yeast rash in the groin. We refilled nystatin powder 8. Hypertension- 9. Tachycardia-she relates this to anxiety from the appointment today. I recommend she followup with Dr. Juleen China  Disposition:  Ms. Forstrom remains in clinical remission from colon cancer. She does not wish to have the Port-A-Cath removed or the colostomy reversed. She will return for a Port-A-Cath flush every 6 weeks. Ms. Glantz will be scheduled for a 6 month office  visit.  I suspect the fullness and tenderness in the right low abdomen is a benign finding. She will contact us for a palpable change or pain in this area.   Thornton Papas, MD  10/11/2012  10:06 AM

## 2012-10-16 ENCOUNTER — Telehealth: Payer: Self-pay | Admitting: *Deleted

## 2012-10-16 NOTE — Telephone Encounter (Signed)
Called pt with lab results. CEA is normal, per Dr. Truett Perna. She voiced understanding.

## 2012-10-16 NOTE — Telephone Encounter (Signed)
Message copied by Caleb Popp on Tue Oct 16, 2012 10:51 AM ------      Message from: Ladene Artist      Created: Fri Oct 12, 2012  9:16 PM       Please call patient, cea is normal ------

## 2012-11-16 ENCOUNTER — Telehealth: Payer: Self-pay | Admitting: *Deleted

## 2012-11-16 NOTE — Telephone Encounter (Signed)
Patent's daughter called and moved her flush appt to morning.    JMW

## 2012-11-22 ENCOUNTER — Ambulatory Visit (HOSPITAL_BASED_OUTPATIENT_CLINIC_OR_DEPARTMENT_OTHER): Payer: Medicare Other

## 2012-11-22 VITALS — BP 151/78 | HR 119 | Temp 97.0°F | Resp 22

## 2012-11-22 DIAGNOSIS — C189 Malignant neoplasm of colon, unspecified: Secondary | ICD-10-CM

## 2012-11-22 DIAGNOSIS — Z452 Encounter for adjustment and management of vascular access device: Secondary | ICD-10-CM

## 2012-11-22 MED ORDER — HEPARIN SOD (PORK) LOCK FLUSH 100 UNIT/ML IV SOLN
500.0000 [IU] | Freq: Once | INTRAVENOUS | Status: AC
  Administered 2012-11-22: 500 [IU] via INTRAVENOUS
  Filled 2012-11-22: qty 5

## 2012-11-22 MED ORDER — SODIUM CHLORIDE 0.9 % IJ SOLN
10.0000 mL | INTRAMUSCULAR | Status: DC | PRN
  Administered 2012-11-22: 10 mL via INTRAVENOUS
  Filled 2012-11-22: qty 10

## 2012-11-22 NOTE — Patient Instructions (Signed)
Call MD for problems or concerns 

## 2013-01-03 ENCOUNTER — Ambulatory Visit (HOSPITAL_BASED_OUTPATIENT_CLINIC_OR_DEPARTMENT_OTHER): Payer: Medicare Other

## 2013-01-03 VITALS — BP 189/88 | HR 113 | Temp 97.7°F | Resp 24

## 2013-01-03 DIAGNOSIS — C189 Malignant neoplasm of colon, unspecified: Secondary | ICD-10-CM

## 2013-01-03 DIAGNOSIS — Z452 Encounter for adjustment and management of vascular access device: Secondary | ICD-10-CM

## 2013-01-03 MED ORDER — HEPARIN SOD (PORK) LOCK FLUSH 100 UNIT/ML IV SOLN
500.0000 [IU] | Freq: Once | INTRAVENOUS | Status: AC
  Administered 2013-01-03: 500 [IU] via INTRAVENOUS
  Filled 2013-01-03: qty 5

## 2013-01-03 MED ORDER — SODIUM CHLORIDE 0.9 % IJ SOLN
10.0000 mL | INTRAMUSCULAR | Status: DC | PRN
  Administered 2013-01-03: 10 mL via INTRAVENOUS
  Filled 2013-01-03: qty 10

## 2013-01-03 NOTE — Patient Instructions (Signed)
Call MD for problems or concerns 

## 2013-01-10 ENCOUNTER — Other Ambulatory Visit: Payer: Self-pay | Admitting: Oncology

## 2013-01-10 DIAGNOSIS — C189 Malignant neoplasm of colon, unspecified: Secondary | ICD-10-CM

## 2013-02-14 ENCOUNTER — Ambulatory Visit (HOSPITAL_BASED_OUTPATIENT_CLINIC_OR_DEPARTMENT_OTHER): Payer: Medicare Other

## 2013-02-14 VITALS — BP 187/100 | HR 109 | Temp 97.6°F | Resp 20

## 2013-02-14 DIAGNOSIS — D126 Benign neoplasm of colon, unspecified: Secondary | ICD-10-CM

## 2013-02-14 DIAGNOSIS — Z452 Encounter for adjustment and management of vascular access device: Secondary | ICD-10-CM

## 2013-02-14 MED ORDER — HEPARIN SOD (PORK) LOCK FLUSH 100 UNIT/ML IV SOLN
500.0000 [IU] | Freq: Once | INTRAVENOUS | Status: AC
  Administered 2013-02-14: 500 [IU] via INTRAVENOUS
  Filled 2013-02-14: qty 5

## 2013-02-14 MED ORDER — SODIUM CHLORIDE 0.9 % IJ SOLN
10.0000 mL | INTRAMUSCULAR | Status: DC | PRN
  Administered 2013-02-14: 10 mL via INTRAVENOUS
  Filled 2013-02-14: qty 10

## 2013-02-14 NOTE — Patient Instructions (Addendum)

## 2013-03-29 ENCOUNTER — Telehealth: Payer: Self-pay | Admitting: Oncology

## 2013-03-29 NOTE — Telephone Encounter (Signed)
Pt called r/s all appt from october to April 2015

## 2013-04-10 ENCOUNTER — Ambulatory Visit (HOSPITAL_BASED_OUTPATIENT_CLINIC_OR_DEPARTMENT_OTHER): Payer: Medicare Other

## 2013-04-10 ENCOUNTER — Telehealth: Payer: Self-pay | Admitting: Oncology

## 2013-04-10 ENCOUNTER — Encounter (INDEPENDENT_AMBULATORY_CARE_PROVIDER_SITE_OTHER): Payer: Self-pay

## 2013-04-10 VITALS — BP 215/90 | HR 119 | Temp 98.5°F | Resp 20

## 2013-04-10 DIAGNOSIS — C189 Malignant neoplasm of colon, unspecified: Secondary | ICD-10-CM

## 2013-04-10 MED ORDER — HEPARIN SOD (PORK) LOCK FLUSH 100 UNIT/ML IV SOLN
500.0000 [IU] | Freq: Once | INTRAVENOUS | Status: AC
  Administered 2013-04-10: 500 [IU] via INTRAVENOUS
  Filled 2013-04-10: qty 5

## 2013-04-10 MED ORDER — SODIUM CHLORIDE 0.9 % IJ SOLN
10.0000 mL | INTRAMUSCULAR | Status: DC | PRN
  Administered 2013-04-10: 10 mL via INTRAVENOUS
  Filled 2013-04-10: qty 10

## 2013-04-10 NOTE — Telephone Encounter (Signed)
Gave pt appt calendar for flush and MD on April lab and Md

## 2013-04-11 ENCOUNTER — Other Ambulatory Visit: Payer: Medicare Other | Admitting: Lab

## 2013-04-11 ENCOUNTER — Ambulatory Visit: Payer: Medicare Other | Admitting: Oncology

## 2013-05-22 ENCOUNTER — Ambulatory Visit (HOSPITAL_BASED_OUTPATIENT_CLINIC_OR_DEPARTMENT_OTHER): Payer: Medicare Other

## 2013-05-22 VITALS — BP 202/78 | HR 113 | Temp 97.2°F

## 2013-05-22 DIAGNOSIS — Z452 Encounter for adjustment and management of vascular access device: Secondary | ICD-10-CM

## 2013-05-22 DIAGNOSIS — C189 Malignant neoplasm of colon, unspecified: Secondary | ICD-10-CM

## 2013-05-22 MED ORDER — HEPARIN SOD (PORK) LOCK FLUSH 100 UNIT/ML IV SOLN
500.0000 [IU] | Freq: Once | INTRAVENOUS | Status: AC
  Administered 2013-05-22: 500 [IU] via INTRAVENOUS
  Filled 2013-05-22: qty 5

## 2013-05-22 MED ORDER — SODIUM CHLORIDE 0.9 % IJ SOLN
10.0000 mL | INTRAMUSCULAR | Status: DC | PRN
  Administered 2013-05-22: 10 mL via INTRAVENOUS
  Filled 2013-05-22: qty 10

## 2013-05-22 NOTE — Patient Instructions (Signed)
Implanted Port Instructions  An implanted port is a central line that has a round shape and is placed under the skin. It is used for long-term IV (intravenous) access for:  · Medicine.  · Fluids.  · Liquid nutrition, such as TPN (total parenteral nutrition).  · Blood samples.  Ports can be placed:  · In the chest area just below the collarbone (this is the most common place.)  · In the arms.  · In the belly (abdomen) area.  · In the legs.  PARTS OF THE PORT  A port has 2 main parts:  · The reservoir. The reservoir is round, disc-shaped, and will be a small, raised area under your skin.  · The reservoir is the part where a needle is inserted (accessed) to either give medicines or to draw blood.  · The catheter. The catheter is a long, slender tube that extends from the reservoir. The catheter is placed into a large vein.  · Medicine that is inserted into the reservoir goes into the catheter and then into the vein.  INSERTION OF THE PORT  · The port is surgically placed in either an operating room or in a procedural area (interventional radiology).  · Medicine may be given to help you relax during the procedure.  · The skin where the port will be inserted is numbed (local anesthetic).  · 1 or 2 small cuts (incisions) will be made in the skin to insert the port.  · The port can be used after it has been inserted.  INCISION SITE CARE  · The incision site may have small adhesive strips on it. This helps keep the incision site closed. Sometimes, no adhesive strips are placed. Instead of adhesive strips, a special kind of surgical glue is used to keep the incision closed.  · If adhesive strips were placed on the incision sites, do not take them off. They will fall off on their own.  · The incision site may be sore for 1 to 2 days. Pain medicine can help.  · Do not get the incision site wet. Bathe or shower as directed by your caregiver.  · The incision site should heal in 5 to 7 days. A small scar may form after the  incision has healed.  ACCESSING THE PORT  Special steps must be taken to access the port:  · Before the port is accessed, a numbing cream can be placed on the skin. This helps numb the skin over the port site.  · A sterile technique is used to access the port.  · The port is accessed with a needle. Only "non-coring" port needles should be used to access the port. Once the port is accessed, a blood return should be checked. This helps ensure the port is in the vein and is not clogged (clotted).  · If your caregiver believes your port should remain accessed, a clear (transparent) bandage will be placed over the needle site. The bandage and needle will need to be changed every week or as directed by your caregiver.  · Keep the bandage covering the needle clean and dry. Do not get it wet. Follow your caregiver's instructions on how to take a shower or bath when the port is accessed.  · If your port does not need to stay accessed, no bandage is needed over the port.  FLUSHING THE PORT  Flushing the port keeps it from getting clogged. How often the port is flushed depends on:  · If a   constant infusion is running. If a constant infusion is running, the port may not need to be flushed.  · If intermittent medicines are given.  · If the port is not being used.  For intermittent medicines:  · The port will need to be flushed:  · After medicines have been given.  · After blood has been drawn.  · As part of routine maintenance.  · A port is normally flushed with:  · Normal saline.  · Heparin.  · Follow your caregiver's advice on how often, how much, and the type of flush to use on your port.  IMPORTANT PORT INFORMATION  · Tell your caregiver if you are allergic to heparin.  · After your port is placed, you will get a manufacturer's information card. The card has information about your port. Keep this card with you at all times.  · There are many types of ports available. Know what kind of port you have.  · In case of an  emergency, it may be helpful to wear a medical alert bracelet. This can help alert health care workers that you have a port.  · The port can stay in for as long as your caregiver believes it is necessary.  · When it is time for the port to come out, surgery will be done to remove it. The surgery will be similar to how the port was put in.  · If you are in the hospital or clinic:  · Your port will be taken care of and flushed by a nurse.  · If you are at home:  · A home health care nurse may give medicines and take care of the port.  · You or a family member can get special training and directions for giving medicine and taking care of the port at home.  SEEK IMMEDIATE MEDICAL CARE IF:   · Your port does not flush or you are unable to get a blood return.  · New drainage or pus is coming from the incision.  · A bad smell is coming from the incision site.  · You develop swelling or increased redness at the incision site.  · You develop increased swelling or pain at the port site.  · You develop swelling or pain in the surrounding skin near the port.  · You have an oral temperature above 102° F (38.9° C), not controlled by medicine.  MAKE SURE YOU:   · Understand these instructions.  · Will watch your condition.  · Will get help right away if you are not doing well or get worse.  Document Released: 06/13/2005 Document Revised: 09/05/2011 Document Reviewed: 09/04/2008  [employer]® Patient Information ©2014 [employer].

## 2013-07-03 ENCOUNTER — Ambulatory Visit (HOSPITAL_BASED_OUTPATIENT_CLINIC_OR_DEPARTMENT_OTHER): Payer: Medicare Other

## 2013-07-03 VITALS — BP 190/80 | HR 100 | Temp 97.4°F

## 2013-07-03 DIAGNOSIS — C189 Malignant neoplasm of colon, unspecified: Secondary | ICD-10-CM

## 2013-07-03 DIAGNOSIS — Z452 Encounter for adjustment and management of vascular access device: Secondary | ICD-10-CM

## 2013-07-03 MED ORDER — HEPARIN SOD (PORK) LOCK FLUSH 100 UNIT/ML IV SOLN
500.0000 [IU] | Freq: Once | INTRAVENOUS | Status: AC
  Administered 2013-07-03: 500 [IU] via INTRAVENOUS
  Filled 2013-07-03: qty 5

## 2013-07-03 MED ORDER — SODIUM CHLORIDE 0.9 % IJ SOLN
10.0000 mL | INTRAMUSCULAR | Status: DC | PRN
  Administered 2013-07-03: 10 mL via INTRAVENOUS
  Filled 2013-07-03: qty 10

## 2013-07-03 NOTE — Patient Instructions (Signed)
Implanted Port Instructions  An implanted port is a central line that has a round shape and is placed under the skin. It is used for long-term IV (intravenous) access for:  · Medicine.  · Fluids.  · Liquid nutrition, such as TPN (total parenteral nutrition).  · Blood samples.  Ports can be placed:  · In the chest area just below the collarbone (this is the most common place.)  · In the arms.  · In the belly (abdomen) area.  · In the legs.  PARTS OF THE PORT  A port has 2 main parts:  · The reservoir. The reservoir is round, disc-shaped, and will be a small, raised area under your skin.  · The reservoir is the part where a needle is inserted (accessed) to either give medicines or to draw blood.  · The catheter. The catheter is a long, slender tube that extends from the reservoir. The catheter is placed into a large vein.  · Medicine that is inserted into the reservoir goes into the catheter and then into the vein.  INSERTION OF THE PORT  · The port is surgically placed in either an operating room or in a procedural area (interventional radiology).  · Medicine may be given to help you relax during the procedure.  · The skin where the port will be inserted is numbed (local anesthetic).  · 1 or 2 small cuts (incisions) will be made in the skin to insert the port.  · The port can be used after it has been inserted.  INCISION SITE CARE  · The incision site may have small adhesive strips on it. This helps keep the incision site closed. Sometimes, no adhesive strips are placed. Instead of adhesive strips, a special kind of surgical glue is used to keep the incision closed.  · If adhesive strips were placed on the incision sites, do not take them off. They will fall off on their own.  · The incision site may be sore for 1 to 2 days. Pain medicine can help.  · Do not get the incision site wet. Bathe or shower as directed by your caregiver.  · The incision site should heal in 5 to 7 days. A small scar may form after the  incision has healed.  ACCESSING THE PORT  Special steps must be taken to access the port:  · Before the port is accessed, a numbing cream can be placed on the skin. This helps numb the skin over the port site.  · A sterile technique is used to access the port.  · The port is accessed with a needle. Only "non-coring" port needles should be used to access the port. Once the port is accessed, a blood return should be checked. This helps ensure the port is in the vein and is not clogged (clotted).  · If your caregiver believes your port should remain accessed, a clear (transparent) bandage will be placed over the needle site. The bandage and needle will need to be changed every week or as directed by your caregiver.  · Keep the bandage covering the needle clean and dry. Do not get it wet. Follow your caregiver's instructions on how to take a shower or bath when the port is accessed.  · If your port does not need to stay accessed, no bandage is needed over the port.  FLUSHING THE PORT  Flushing the port keeps it from getting clogged. How often the port is flushed depends on:  · If a   constant infusion is running. If a constant infusion is running, the port may not need to be flushed.  · If intermittent medicines are given.  · If the port is not being used.  For intermittent medicines:  · The port will need to be flushed:  · After medicines have been given.  · After blood has been drawn.  · As part of routine maintenance.  · A port is normally flushed with:  · Normal saline.  · Heparin.  · Follow your caregiver's advice on how often, how much, and the type of flush to use on your port.  IMPORTANT PORT INFORMATION  · Tell your caregiver if you are allergic to heparin.  · After your port is placed, you will get a manufacturer's information card. The card has information about your port. Keep this card with you at all times.  · There are many types of ports available. Know what kind of port you have.  · In case of an  emergency, it may be helpful to wear a medical alert bracelet. This can help alert health care workers that you have a port.  · The port can stay in for as long as your caregiver believes it is necessary.  · When it is time for the port to come out, surgery will be done to remove it. The surgery will be similar to how the port was put in.  · If you are in the hospital or clinic:  · Your port will be taken care of and flushed by a nurse.  · If you are at home:  · A home health care nurse may give medicines and take care of the port.  · You or a family member can get special training and directions for giving medicine and taking care of the port at home.  SEEK IMMEDIATE MEDICAL CARE IF:   · Your port does not flush or you are unable to get a blood return.  · New drainage or pus is coming from the incision.  · A bad smell is coming from the incision site.  · You develop swelling or increased redness at the incision site.  · You develop increased swelling or pain at the port site.  · You develop swelling or pain in the surrounding skin near the port.  · You have an oral temperature above 102° F (38.9° C), not controlled by medicine.  MAKE SURE YOU:   · Understand these instructions.  · Will watch your condition.  · Will get help right away if you are not doing well or get worse.  Document Released: 06/13/2005 Document Revised: 09/05/2011 Document Reviewed: 09/04/2008  [employer]® Patient Information ©2014 [employer].

## 2013-07-29 ENCOUNTER — Telehealth: Payer: Self-pay | Admitting: Oncology

## 2013-07-29 NOTE — Telephone Encounter (Signed)
Talked to pt and gave her appt for 4/20 am appt for lab and MD

## 2013-08-14 ENCOUNTER — Ambulatory Visit (HOSPITAL_BASED_OUTPATIENT_CLINIC_OR_DEPARTMENT_OTHER): Payer: Medicare Other

## 2013-08-14 VITALS — BP 146/94 | HR 112 | Temp 97.1°F

## 2013-08-14 DIAGNOSIS — Z452 Encounter for adjustment and management of vascular access device: Secondary | ICD-10-CM

## 2013-08-14 DIAGNOSIS — Z95828 Presence of other vascular implants and grafts: Secondary | ICD-10-CM

## 2013-08-14 DIAGNOSIS — C189 Malignant neoplasm of colon, unspecified: Secondary | ICD-10-CM

## 2013-08-14 MED ORDER — SODIUM CHLORIDE 0.9 % IJ SOLN
10.0000 mL | INTRAMUSCULAR | Status: DC | PRN
  Administered 2013-08-14: 10 mL via INTRAVENOUS
  Filled 2013-08-14: qty 10

## 2013-08-14 MED ORDER — HEPARIN SOD (PORK) LOCK FLUSH 100 UNIT/ML IV SOLN
500.0000 [IU] | Freq: Once | INTRAVENOUS | Status: AC
  Administered 2013-08-14: 500 [IU] via INTRAVENOUS
  Filled 2013-08-14: qty 5

## 2013-08-14 NOTE — Patient Instructions (Signed)

## 2013-10-14 ENCOUNTER — Other Ambulatory Visit: Payer: Self-pay

## 2013-10-14 ENCOUNTER — Encounter: Payer: Self-pay | Admitting: Oncology

## 2013-10-14 ENCOUNTER — Telehealth: Payer: Self-pay | Admitting: Oncology

## 2013-10-14 ENCOUNTER — Ambulatory Visit (HOSPITAL_BASED_OUTPATIENT_CLINIC_OR_DEPARTMENT_OTHER): Payer: Medicare Other

## 2013-10-14 ENCOUNTER — Ambulatory Visit (HOSPITAL_BASED_OUTPATIENT_CLINIC_OR_DEPARTMENT_OTHER): Payer: Medicare Other | Admitting: Oncology

## 2013-10-14 ENCOUNTER — Other Ambulatory Visit: Payer: Medicare Other

## 2013-10-14 VITALS — BP 161/102 | HR 108 | Temp 98.0°F | Resp 18 | Ht 63.0 in | Wt 255.5 lb

## 2013-10-14 DIAGNOSIS — I10 Essential (primary) hypertension: Secondary | ICD-10-CM

## 2013-10-14 DIAGNOSIS — Z85038 Personal history of other malignant neoplasm of large intestine: Secondary | ICD-10-CM

## 2013-10-14 DIAGNOSIS — IMO0002 Reserved for concepts with insufficient information to code with codable children: Secondary | ICD-10-CM

## 2013-10-14 DIAGNOSIS — M549 Dorsalgia, unspecified: Secondary | ICD-10-CM

## 2013-10-14 DIAGNOSIS — B372 Candidiasis of skin and nail: Secondary | ICD-10-CM

## 2013-10-14 DIAGNOSIS — G8929 Other chronic pain: Secondary | ICD-10-CM

## 2013-10-14 DIAGNOSIS — Z933 Colostomy status: Secondary | ICD-10-CM

## 2013-10-14 DIAGNOSIS — Z95828 Presence of other vascular implants and grafts: Secondary | ICD-10-CM

## 2013-10-14 DIAGNOSIS — Z452 Encounter for adjustment and management of vascular access device: Secondary | ICD-10-CM

## 2013-10-14 MED ORDER — HEPARIN SOD (PORK) LOCK FLUSH 100 UNIT/ML IV SOLN
500.0000 [IU] | Freq: Once | INTRAVENOUS | Status: AC
  Administered 2013-10-14: 500 [IU] via INTRAVENOUS
  Filled 2013-10-14: qty 5

## 2013-10-14 MED ORDER — SODIUM CHLORIDE 0.9 % IJ SOLN
10.0000 mL | INTRAMUSCULAR | Status: DC | PRN
  Administered 2013-10-14: 10 mL via INTRAVENOUS
  Filled 2013-10-14: qty 10

## 2013-10-14 NOTE — Patient Instructions (Signed)

## 2013-10-14 NOTE — Telephone Encounter (Signed)
Gave pt appt for MD and flushes until October 2015

## 2013-10-14 NOTE — Progress Notes (Signed)
  [place] OFFICE PROGRESS NOTE   Diagnosis: Colon cancer  INTERVAL HISTORY:   Diane Schroeder was last seen at the [facility] one year ago. She reports being diagnosed with psoriasis and is followed by dermatology. Occasional discomfort in the right lower abdomen. Stable back pain. Good appetite. No problem with the Port-A-Cath or colostomy.  Objective:  Vital signs in last 24 hours:  Blood pressure 161/102, pulse 108, temperature 98 F (36.7 C), temperature source Oral, resp. rate 18, height 5\' 3"  (1.6 m), weight 255 lb 8 oz (115.894 kg), SpO2 98.00%.    HEENT: Neck without mass Lymphatics: No cervical, supraclavicular, axillary, or inguinal nodes. Prominent right greater than left axillary fat pad. Resp: Lungs clear bilaterally Cardio: Regular rate and rhythm GI: No hepatomegaly, left lower quadrant colostomy. Mild tenderness in the low abdomen bilaterally. No mass. Vascular: No leg edema  Skin: Erythematous plaque at the lower back, yeast rash in the groin bilaterally and under the right breast  Portacath/PICC-without erythema   Lab Results  Component Value Date   CEA <0.5 10/11/2012    Imaging:  No results found.  Medications: I have reviewed the patient's current medications.  Assessment/Plan: 1. Locally advanced colon cancer diagnosed in April of 2005, she remains in clinical remission. 2. Indwelling Port-A-Cath. The Port-A-Cath is flushed every 6 weeks. We again discussed removing the Port-A-Cath. Diane Schroeder would like to leave the Port-A-Cath in place. 3. Chronic back pain. 4. Weight loss following the death of her husband, resolved. 5. Left lower quadrant parastomal hernia. 6. Psoriasis. 7. Chronic Yeast rash in the groin. We refilled nystatin powder 8. Hypertension-the blood pressure will be repeated today Disposition:  Diane Schroeder remains in clinical remission from colon cancer. I recommended she have the Port-A-Cath removed. She will not agree to  this at present but says she will think about having the Port-A-Cath removed in the near future. I also recommended she schedule upon with Dr. Dalbert Batman to consider takedown of the colostomy. She declined this referral. She declines a colonoscopy.  The Port-A-Cath was flushed today. She will return for a Port-A-Cath flush every 6 weeks. Diane Schroeder will return for an office visit in 6 months. Ladell Pier, MD  10/14/2013  9:58 AM

## 2013-11-25 ENCOUNTER — Ambulatory Visit (HOSPITAL_BASED_OUTPATIENT_CLINIC_OR_DEPARTMENT_OTHER): Payer: Medicare Other

## 2013-11-25 VITALS — BP 157/80 | HR 112 | Temp 98.2°F

## 2013-11-25 DIAGNOSIS — Z452 Encounter for adjustment and management of vascular access device: Secondary | ICD-10-CM

## 2013-11-25 DIAGNOSIS — C187 Malignant neoplasm of sigmoid colon: Secondary | ICD-10-CM

## 2013-11-25 DIAGNOSIS — Z95828 Presence of other vascular implants and grafts: Secondary | ICD-10-CM

## 2013-11-25 MED ORDER — HEPARIN SOD (PORK) LOCK FLUSH 100 UNIT/ML IV SOLN
500.0000 [IU] | Freq: Once | INTRAVENOUS | Status: AC
  Administered 2013-11-25: 500 [IU] via INTRAVENOUS
  Filled 2013-11-25: qty 5

## 2013-11-25 MED ORDER — SODIUM CHLORIDE 0.9 % IJ SOLN
10.0000 mL | INTRAMUSCULAR | Status: DC | PRN
  Administered 2013-11-25: 10 mL via INTRAVENOUS
  Filled 2013-11-25: qty 10

## 2013-11-25 NOTE — Patient Instructions (Signed)

## 2013-12-11 ENCOUNTER — Other Ambulatory Visit: Payer: Self-pay | Admitting: *Deleted

## 2013-12-11 MED ORDER — LIDOCAINE-PRILOCAINE 2.5-2.5 % EX CREA: TOPICAL_CREAM | CUTANEOUS | Status: AC

## 2014-01-06 ENCOUNTER — Ambulatory Visit (HOSPITAL_BASED_OUTPATIENT_CLINIC_OR_DEPARTMENT_OTHER): Payer: Medicare Other

## 2014-01-06 ENCOUNTER — Telehealth: Payer: Self-pay | Admitting: Oncology

## 2014-01-06 VITALS — BP 134/67 | HR 107 | Temp 98.0°F | Resp 16

## 2014-01-06 DIAGNOSIS — Z95828 Presence of other vascular implants and grafts: Secondary | ICD-10-CM

## 2014-01-06 DIAGNOSIS — C187 Malignant neoplasm of sigmoid colon: Secondary | ICD-10-CM

## 2014-01-06 DIAGNOSIS — Z452 Encounter for adjustment and management of vascular access device: Secondary | ICD-10-CM

## 2014-01-06 MED ORDER — HEPARIN SOD (PORK) LOCK FLUSH 100 UNIT/ML IV SOLN
500.0000 [IU] | Freq: Once | INTRAVENOUS | Status: AC
  Administered 2014-01-06: 500 [IU] via INTRAVENOUS
  Filled 2014-01-06: qty 5

## 2014-01-06 MED ORDER — SODIUM CHLORIDE 0.9 % IJ SOLN
10.0000 mL | INTRAMUSCULAR | Status: DC | PRN
  Administered 2014-01-06: 10 mL via INTRAVENOUS
  Filled 2014-01-06: qty 10

## 2014-01-06 NOTE — Patient Instructions (Signed)

## 2014-01-06 NOTE — Telephone Encounter (Signed)
Pt's daughter called wanted to get pt in earlier, pt was at another apt and wanted to come on in this morning, r/s for 9:45....k

## 2014-02-17 ENCOUNTER — Ambulatory Visit (HOSPITAL_BASED_OUTPATIENT_CLINIC_OR_DEPARTMENT_OTHER): Payer: Medicare Other

## 2014-02-17 VITALS — BP 190/78 | HR 101 | Temp 97.9°F

## 2014-02-17 DIAGNOSIS — Z452 Encounter for adjustment and management of vascular access device: Secondary | ICD-10-CM

## 2014-02-17 DIAGNOSIS — C187 Malignant neoplasm of sigmoid colon: Secondary | ICD-10-CM

## 2014-02-17 DIAGNOSIS — Z95828 Presence of other vascular implants and grafts: Secondary | ICD-10-CM

## 2014-02-17 MED ORDER — HEPARIN SOD (PORK) LOCK FLUSH 100 UNIT/ML IV SOLN
500.0000 [IU] | Freq: Once | INTRAVENOUS | Status: AC
  Administered 2014-02-17: 500 [IU] via INTRAVENOUS
  Filled 2014-02-17: qty 5

## 2014-02-17 MED ORDER — SODIUM CHLORIDE 0.9 % IJ SOLN
10.0000 mL | INTRAMUSCULAR | Status: DC | PRN
  Administered 2014-02-17: 10 mL via INTRAVENOUS
  Filled 2014-02-17: qty 10

## 2014-02-17 NOTE — Patient Instructions (Signed)

## 2014-04-18 ENCOUNTER — Ambulatory Visit (HOSPITAL_BASED_OUTPATIENT_CLINIC_OR_DEPARTMENT_OTHER): Payer: Medicare Other | Admitting: Oncology

## 2014-04-18 ENCOUNTER — Telehealth: Payer: Self-pay | Admitting: Oncology

## 2014-04-18 ENCOUNTER — Ambulatory Visit (HOSPITAL_BASED_OUTPATIENT_CLINIC_OR_DEPARTMENT_OTHER): Payer: Medicare Other

## 2014-04-18 VITALS — BP 148/88 | HR 122 | Temp 98.1°F | Resp 20 | Ht 63.0 in | Wt 240.0 lb

## 2014-04-18 DIAGNOSIS — Z85038 Personal history of other malignant neoplasm of large intestine: Secondary | ICD-10-CM

## 2014-04-18 DIAGNOSIS — C189 Malignant neoplasm of colon, unspecified: Secondary | ICD-10-CM

## 2014-04-18 DIAGNOSIS — Z95828 Presence of other vascular implants and grafts: Secondary | ICD-10-CM

## 2014-04-18 DIAGNOSIS — Z452 Encounter for adjustment and management of vascular access device: Secondary | ICD-10-CM

## 2014-04-18 MED ORDER — SODIUM CHLORIDE 0.9 % IJ SOLN
10.0000 mL | INTRAMUSCULAR | Status: DC | PRN
  Administered 2014-04-18: 10 mL via INTRAVENOUS
  Filled 2014-04-18: qty 10

## 2014-04-18 MED ORDER — HEPARIN SOD (PORK) LOCK FLUSH 100 UNIT/ML IV SOLN
500.0000 [IU] | Freq: Once | INTRAVENOUS | Status: AC
  Administered 2014-04-18: 500 [IU] via INTRAVENOUS
  Filled 2014-04-18: qty 5

## 2014-04-18 NOTE — Patient Instructions (Signed)

## 2014-04-18 NOTE — Telephone Encounter (Signed)
Gave avs & cal Dec-April 2016

## 2014-04-18 NOTE — Progress Notes (Signed)
  [place] OFFICE PROGRESS NOTE   Diagnosis: Colon cancer  INTERVAL HISTORY:   Ms. Ouk returns as scheduled. She has intermittent back pain. She complains of insomnia. She remains anxious about removing the Port-A-Cath.  Objective:  Vital signs in last 24 hours:  Blood pressure 148/88, pulse 122, temperature 98.1 F (36.7 C), temperature source Oral, resp. rate 20, height 5\' 3"  (1.6 m), weight 240 lb (108.863 kg), SpO2 100.00%.    HEENT: Neck without mass Lymphatics: No cervical, supraclavicular, axillary, or inguinal nodes Resp: Lungs clear bilaterally Cardio: Regular rate and rhythm, tachycardia GI: No hepatomegaly, no mass, nontender, left lower quadrant colostomy with a parastomal hernia Vascular: No leg edema  Skin: Yeast rash in the groin bilaterally   Portacath/PICC-without erythema  Medications: I have reviewed the patient's current medications.  Assessment/Plan: 1. Locally advanced colon cancer diagnosed in April of 2005, she remains in clinical remission. 2. Indwelling Port-A-Cath. The Port-A-Cath is flushed every 6 weeks. We again discussed removing the Port-A-Cath. Ms. Campanile would like to leave the Port-A-Cath in place. 3. Chronic back pain. 4. Weight loss following the death of her husband, resolved. 5. Left lower quadrant parastomal hernia. 6. Psoriasis. 7. Chronic Yeast rash in the groin. We refilled nystatin powder 8. Hypertension-I. recommended she followup with Dr. Wilson Singer   Disposition:  Ms. Benton remains in clinical remission from colon cancer. I encouraged her to followup with Dr. Wilson Singer for internal medicine care. I also recommended she schedule an appointment with Dr. Dalbert Batman to discuss removal of the Port-A-Cath and reversal of the colostomy. She does not wish to schedule this appointment at present. She declined an influenza vaccine today.  The Port-A-Cath will remain in place and she will return for a flush every 6 weeks. She is  scheduled for a 6 month office visit.  Betsy Coder, MD  04/18/2014  10:57 AM

## 2014-06-02 ENCOUNTER — Ambulatory Visit (HOSPITAL_BASED_OUTPATIENT_CLINIC_OR_DEPARTMENT_OTHER): Payer: Medicare Other

## 2014-06-02 VITALS — BP 180/83 | HR 80 | Temp 98.2°F

## 2014-06-02 DIAGNOSIS — Z452 Encounter for adjustment and management of vascular access device: Secondary | ICD-10-CM

## 2014-06-02 DIAGNOSIS — Z95828 Presence of other vascular implants and grafts: Secondary | ICD-10-CM

## 2014-06-02 DIAGNOSIS — C187 Malignant neoplasm of sigmoid colon: Secondary | ICD-10-CM

## 2014-06-02 MED ORDER — SODIUM CHLORIDE 0.9 % IJ SOLN
10.0000 mL | INTRAMUSCULAR | Status: DC | PRN
  Administered 2014-06-02: 10 mL via INTRAVENOUS
  Filled 2014-06-02: qty 10

## 2014-06-02 MED ORDER — HEPARIN SOD (PORK) LOCK FLUSH 100 UNIT/ML IV SOLN
500.0000 [IU] | Freq: Once | INTRAVENOUS | Status: AC
  Administered 2014-06-02: 500 [IU] via INTRAVENOUS
  Filled 2014-06-02: qty 5

## 2014-06-02 NOTE — Patient Instructions (Signed)

## 2014-07-14 ENCOUNTER — Ambulatory Visit (HOSPITAL_BASED_OUTPATIENT_CLINIC_OR_DEPARTMENT_OTHER): Payer: Medicare Other

## 2014-07-14 VITALS — BP 150/100 | HR 117

## 2014-07-14 DIAGNOSIS — C187 Malignant neoplasm of sigmoid colon: Secondary | ICD-10-CM

## 2014-07-14 DIAGNOSIS — Z95828 Presence of other vascular implants and grafts: Secondary | ICD-10-CM

## 2014-07-14 DIAGNOSIS — Z452 Encounter for adjustment and management of vascular access device: Secondary | ICD-10-CM

## 2014-07-14 MED ORDER — SODIUM CHLORIDE 0.9 % IJ SOLN
10.0000 mL | INTRAMUSCULAR | Status: DC | PRN
  Administered 2014-07-14: 10 mL via INTRAVENOUS
  Filled 2014-07-14: qty 10

## 2014-07-14 MED ORDER — HEPARIN SOD (PORK) LOCK FLUSH 100 UNIT/ML IV SOLN
500.0000 [IU] | Freq: Once | INTRAVENOUS | Status: AC
  Administered 2014-07-14: 500 [IU] via INTRAVENOUS
  Filled 2014-07-14: qty 5

## 2014-07-14 NOTE — Patient Instructions (Signed)

## 2014-08-25 ENCOUNTER — Ambulatory Visit (HOSPITAL_BASED_OUTPATIENT_CLINIC_OR_DEPARTMENT_OTHER): Payer: Medicare Other

## 2014-08-25 VITALS — BP 154/83 | HR 73 | Temp 97.9°F

## 2014-08-25 DIAGNOSIS — Z452 Encounter for adjustment and management of vascular access device: Secondary | ICD-10-CM

## 2014-08-25 DIAGNOSIS — Z95828 Presence of other vascular implants and grafts: Secondary | ICD-10-CM

## 2014-08-25 DIAGNOSIS — C187 Malignant neoplasm of sigmoid colon: Secondary | ICD-10-CM

## 2014-08-25 MED ORDER — HEPARIN SOD (PORK) LOCK FLUSH 100 UNIT/ML IV SOLN
500.0000 [IU] | Freq: Once | INTRAVENOUS | Status: AC
  Administered 2014-08-25: 500 [IU] via INTRAVENOUS
  Filled 2014-08-25: qty 5

## 2014-08-25 MED ORDER — SODIUM CHLORIDE 0.9 % IJ SOLN
10.0000 mL | INTRAMUSCULAR | Status: DC | PRN
  Administered 2014-08-25: 10 mL via INTRAVENOUS
  Filled 2014-08-25: qty 10

## 2014-08-25 NOTE — Patient Instructions (Signed)

## 2014-09-29 ENCOUNTER — Other Ambulatory Visit: Payer: Self-pay | Admitting: *Deleted

## 2014-09-29 ENCOUNTER — Telehealth: Payer: Self-pay | Admitting: Oncology

## 2014-09-29 NOTE — Telephone Encounter (Signed)
Called pt kept trying to tell her who I was but she couldn't hear me, mailing out schedule to pt.... KJ

## 2014-10-07 ENCOUNTER — Other Ambulatory Visit: Payer: Medicare Other

## 2014-10-07 ENCOUNTER — Ambulatory Visit: Payer: Medicare Other | Admitting: Oncology

## 2014-10-09 ENCOUNTER — Other Ambulatory Visit: Payer: Self-pay | Admitting: *Deleted

## 2014-10-09 ENCOUNTER — Telehealth: Payer: Self-pay | Admitting: Oncology

## 2014-10-09 NOTE — Telephone Encounter (Signed)
Confirm lab appointment not needed for 04/15.

## 2014-10-10 ENCOUNTER — Ambulatory Visit: Payer: Medicare Other

## 2014-10-10 ENCOUNTER — Telehealth: Payer: Self-pay | Admitting: Oncology

## 2014-10-10 ENCOUNTER — Ambulatory Visit (HOSPITAL_BASED_OUTPATIENT_CLINIC_OR_DEPARTMENT_OTHER): Payer: Medicare Other | Admitting: Oncology

## 2014-10-10 ENCOUNTER — Other Ambulatory Visit: Payer: Medicare Other

## 2014-10-10 VITALS — BP 159/79 | HR 102 | Temp 98.4°F | Resp 19 | Ht 63.0 in | Wt 226.6 lb

## 2014-10-10 DIAGNOSIS — L409 Psoriasis, unspecified: Secondary | ICD-10-CM

## 2014-10-10 DIAGNOSIS — Z95828 Presence of other vascular implants and grafts: Secondary | ICD-10-CM

## 2014-10-10 DIAGNOSIS — I10 Essential (primary) hypertension: Secondary | ICD-10-CM | POA: Diagnosis not present

## 2014-10-10 DIAGNOSIS — Z85038 Personal history of other malignant neoplasm of large intestine: Secondary | ICD-10-CM

## 2014-10-10 DIAGNOSIS — B379 Candidiasis, unspecified: Secondary | ICD-10-CM | POA: Diagnosis not present

## 2014-10-10 DIAGNOSIS — M545 Low back pain: Secondary | ICD-10-CM | POA: Diagnosis not present

## 2014-10-10 DIAGNOSIS — C189 Malignant neoplasm of colon, unspecified: Secondary | ICD-10-CM

## 2014-10-10 MED ORDER — HEPARIN SOD (PORK) LOCK FLUSH 100 UNIT/ML IV SOLN
500.0000 [IU] | Freq: Once | INTRAVENOUS | Status: AC
  Administered 2014-10-10: 500 [IU] via INTRAVENOUS
  Filled 2014-10-10: qty 5

## 2014-10-10 MED ORDER — SODIUM CHLORIDE 0.9 % IJ SOLN
10.0000 mL | INTRAMUSCULAR | Status: DC | PRN
  Administered 2014-10-10: 10 mL via INTRAVENOUS
  Filled 2014-10-10: qty 10

## 2014-10-10 MED ORDER — PROMETHAZINE HCL 25 MG PO TABS: 25.0000 mg | ORAL_TABLET | Freq: Four times a day (QID) | ORAL | Status: AC | PRN

## 2014-10-10 NOTE — Progress Notes (Signed)
  [place] OFFICE PROGRESS NOTE   Diagnosis: Colon cancer  INTERVAL HISTORY:   Diane Schroeder returns as scheduled. She feels well. No difficulty with bowel function. She has a good appetite. She is no longer eating "fast food ".  Objective:  Vital signs in last 24 hours:  Blood pressure 159/79, pulse 102, temperature 98.4 F (36.9 C), temperature source Oral, resp. rate 19, height 5\' 3"  (1.6 m), weight 226 lb 9.6 oz (102.785 kg), SpO2 97 %.    HEENT: Neck without mass Lymphatics: No cervical, supra-clavicular, axillary, or inguinal nodes Resp: Lungs clear bilaterally Cardio: Regular rate and rhythm GI: No hepatomegaly, left lower quadrant colostomy with a parastomal hernia, no mass Vascular: No leg edema  Skin: Yeast rash in the groin bilaterally   Portacath/PICC-without erythema  Medications: I have reviewed the patient's current medications.  Assessment/Plan: 1. Locally advanced colon cancer diagnosed in April of 2005, she remains in clinical remission. 2. Indwelling Port-A-Cath. The Port-A-Cath is flushed every 6 weeks. We again discussed removing the Port-A-Cath. Diane Schroeder would like to leave the Port-A-Cath in place. 3. Chronic back pain. 4. Weight loss following the death of her husband, resolved. 5. Left lower quadrant parastomal hernia. 6. Psoriasis. 7. Chronic Yeast rash in the groin. We refilled nystatin powder 8. Hypertension  Disposition:  Diane Schroeder remains in clinical remission from colon cancer. She does not wish to have the Port-A-Cath removed. We discussed reversal of the colostomy. She does not wish to consider this at present. Diane Schroeder will return for a Port-A-Cath flush every 6 weeks. She will be scheduled for a 6 month office visit.  Betsy Coder, MD  10/10/2014  12:20 PM

## 2014-10-10 NOTE — Patient Instructions (Signed)

## 2014-12-01 ENCOUNTER — Ambulatory Visit (HOSPITAL_BASED_OUTPATIENT_CLINIC_OR_DEPARTMENT_OTHER): Payer: Medicare Other

## 2014-12-01 VITALS — BP 158/90 | HR 99 | Temp 98.1°F | Resp 18

## 2014-12-01 DIAGNOSIS — Z85038 Personal history of other malignant neoplasm of large intestine: Secondary | ICD-10-CM | POA: Diagnosis not present

## 2014-12-01 DIAGNOSIS — Z452 Encounter for adjustment and management of vascular access device: Secondary | ICD-10-CM | POA: Diagnosis present

## 2014-12-01 DIAGNOSIS — Z95828 Presence of other vascular implants and grafts: Secondary | ICD-10-CM

## 2014-12-01 MED ORDER — HEPARIN SOD (PORK) LOCK FLUSH 100 UNIT/ML IV SOLN
500.0000 [IU] | Freq: Once | INTRAVENOUS | Status: AC
  Administered 2014-12-01: 500 [IU] via INTRAVENOUS
  Filled 2014-12-01: qty 5

## 2014-12-01 MED ORDER — SODIUM CHLORIDE 0.9 % IJ SOLN
10.0000 mL | INTRAMUSCULAR | Status: DC | PRN
  Administered 2014-12-01: 10 mL via INTRAVENOUS
  Filled 2014-12-01: qty 10

## 2014-12-01 NOTE — Patient Instructions (Signed)

## 2015-01-12 ENCOUNTER — Ambulatory Visit (HOSPITAL_BASED_OUTPATIENT_CLINIC_OR_DEPARTMENT_OTHER): Payer: Medicare Other

## 2015-01-12 DIAGNOSIS — Z85038 Personal history of other malignant neoplasm of large intestine: Secondary | ICD-10-CM | POA: Diagnosis not present

## 2015-01-12 DIAGNOSIS — Z95828 Presence of other vascular implants and grafts: Secondary | ICD-10-CM

## 2015-01-12 DIAGNOSIS — Z452 Encounter for adjustment and management of vascular access device: Secondary | ICD-10-CM | POA: Diagnosis present

## 2015-01-12 MED ORDER — SODIUM CHLORIDE 0.9 % IJ SOLN
10.0000 mL | INTRAMUSCULAR | Status: DC | PRN
  Administered 2015-01-12: 10 mL via INTRAVENOUS
  Filled 2015-01-12: qty 10

## 2015-01-12 MED ORDER — HEPARIN SOD (PORK) LOCK FLUSH 100 UNIT/ML IV SOLN
500.0000 [IU] | Freq: Once | INTRAVENOUS | Status: AC
  Administered 2015-01-12: 500 [IU] via INTRAVENOUS
  Filled 2015-01-12: qty 5

## 2015-01-12 NOTE — Patient Instructions (Signed)

## 2015-02-23 ENCOUNTER — Ambulatory Visit (HOSPITAL_BASED_OUTPATIENT_CLINIC_OR_DEPARTMENT_OTHER): Payer: Medicare Other

## 2015-02-23 VITALS — BP 149/63 | HR 96 | Temp 97.7°F

## 2015-02-23 DIAGNOSIS — Z452 Encounter for adjustment and management of vascular access device: Secondary | ICD-10-CM

## 2015-02-23 DIAGNOSIS — Z85038 Personal history of other malignant neoplasm of large intestine: Secondary | ICD-10-CM

## 2015-02-23 DIAGNOSIS — Z95828 Presence of other vascular implants and grafts: Secondary | ICD-10-CM

## 2015-02-23 MED ORDER — HEPARIN SOD (PORK) LOCK FLUSH 100 UNIT/ML IV SOLN
500.0000 [IU] | Freq: Once | INTRAVENOUS | Status: AC
  Administered 2015-02-23: 500 [IU] via INTRAVENOUS
  Filled 2015-02-23: qty 5

## 2015-02-23 MED ORDER — SODIUM CHLORIDE 0.9 % IJ SOLN
10.0000 mL | INTRAMUSCULAR | Status: DC | PRN
  Administered 2015-02-23: 10 mL via INTRAVENOUS
  Filled 2015-02-23: qty 10

## 2015-02-23 NOTE — Patient Instructions (Signed)

## 2015-04-06 ENCOUNTER — Ambulatory Visit (HOSPITAL_BASED_OUTPATIENT_CLINIC_OR_DEPARTMENT_OTHER): Payer: Medicare Other | Admitting: Oncology

## 2015-04-06 ENCOUNTER — Other Ambulatory Visit: Payer: Self-pay | Admitting: Oncology

## 2015-04-06 ENCOUNTER — Ambulatory Visit: Payer: Medicare Other

## 2015-04-06 VITALS — BP 146/62 | HR 77 | Temp 98.8°F | Resp 18 | Ht 63.0 in | Wt 202.5 lb

## 2015-04-06 DIAGNOSIS — Z85038 Personal history of other malignant neoplasm of large intestine: Secondary | ICD-10-CM | POA: Diagnosis not present

## 2015-04-06 DIAGNOSIS — G8929 Other chronic pain: Secondary | ICD-10-CM

## 2015-04-06 DIAGNOSIS — R232 Flushing: Secondary | ICD-10-CM

## 2015-04-06 DIAGNOSIS — I10 Essential (primary) hypertension: Secondary | ICD-10-CM

## 2015-04-06 DIAGNOSIS — N6310 Unspecified lump in the right breast, unspecified quadrant: Secondary | ICD-10-CM

## 2015-04-06 DIAGNOSIS — N63 Unspecified lump in breast: Secondary | ICD-10-CM | POA: Diagnosis present

## 2015-04-06 DIAGNOSIS — M545 Low back pain: Secondary | ICD-10-CM | POA: Diagnosis not present

## 2015-04-06 MED ORDER — SODIUM CHLORIDE 0.9 % IJ SOLN
10.0000 mL | INTRAMUSCULAR | Status: DC | PRN
  Filled 2015-04-06: qty 10

## 2015-04-06 MED ORDER — HEPARIN SOD (PORK) LOCK FLUSH 100 UNIT/ML IV SOLN
500.0000 [IU] | Freq: Once | INTRAVENOUS | Status: DC
  Filled 2015-04-06: qty 5

## 2015-04-06 MED ORDER — HEPARIN SOD (PORK) LOCK FLUSH 100 UNIT/ML IV SOLN
500.0000 [IU] | Freq: Once | INTRAVENOUS | Status: AC
  Administered 2015-04-06: 500 [IU] via INTRAVENOUS
  Filled 2015-04-06: qty 5

## 2015-04-06 MED ORDER — SODIUM CHLORIDE 0.9 % IJ SOLN
10.0000 mL | INTRAMUSCULAR | Status: DC | PRN
  Administered 2015-04-06: 10 mL via INTRAVENOUS
  Filled 2015-04-06: qty 10

## 2015-04-06 NOTE — Progress Notes (Signed)
Pt flushed in MD appt.

## 2015-04-06 NOTE — Progress Notes (Signed)
  [place] OFFICE PROGRESS NOTE   Diagnosis: Colon cancer  INTERVAL HISTORY:   Ms. Blazier returns for scheduled visit. She was admitted to [facility] on 02/10/2015 with an altered mental status. She had a seizure. She was noted to have hyponatremia felt to be secondary to polydipsia and HCTZ. She developed multiple skin tears at the lower arms. She was discharged 02/17/2015 reports feeling well. She has chronic back pain. On 03/24/2015 the sodium returned at 130.  Objective:  Vital signs in last 24 hours:  Blood pressure 146/62, pulse 77, temperature 98.8 F (37.1 C), temperature source Oral, resp. rate 18, height 5\' 3"  (1.6 m), weight 202 lb 8 oz (91.853 kg), SpO2 98 %.    HEENT: Neck without mass Lymphatics: 1 cm mobile right axillary node versus a prominent fat pad Resp: Lungs clear bilaterally Cardio: Regular rate and rhythm GI: No hepatomegaly, left lower quadrant colostomy, no mass, nontender Vascular: No leg edema  Skin: Psoriatic plaque versus skin breakdown at the inferior border of the left abdomen ostomy site, yeast rash in the right groin Breasts: Lobulated 3 cm mass lesion in the lower right breast at the 7:00 position   Portacath/PICC-without erythema  Medications: I have reviewed the patient's current medications.  Assessment/Plan: 1. Locally advanced colon cancer diagnosed in April of 2005, she remains in clinical remission. 2. Indwelling Port-A-Cath. The Port-A-Cath is flushed every 6 weeks. We again discussed removing the Port-A-Cath. Ms. Pangilinan would like to leave the Port-A-Cath in place. 3. Chronic back pain. 4. Weight loss following the death of her husband, resolved. 5. Left lower quadrant parastomal hernia. 6. Psoriasis. 7. Chronic Yeast rash in the groin. 8. Hypertension 9. Admission to [facility] with altered mental status and hyponatremia 02/10/2015 10. Schizophrenia 11. Right breast mass-she will be referred for a mammogram and  biopsy as indicated   Disposition:  Ms. Hodgkin remains in clinical remission from colon cancer. She was recently limited to [facility] with an altered mental status and hyponatremia. The sodium level was mildly low on 03/24/2015. She is followed by Dr. Carlis Abbott at [facility].  Ms. Beitler is noted to have a mass in the right breast on exam today. She will be referred for a mammogram and then a biopsy if a mass is confirmed. Ms. Tavella would like to leave the Port-A-Cath in place. She will return for an office visit and Port-A-Cath flush in approximately 4 weeks. We will see her sooner as indicated pending the breast evaluation.  Betsy Coder, MD  04/06/2015  4:14 PM

## 2015-04-06 NOTE — Patient Instructions (Signed)

## 2015-04-14 ENCOUNTER — Other Ambulatory Visit: Payer: Medicare Other

## 2015-04-23 ENCOUNTER — Telehealth: Payer: Self-pay | Admitting: *Deleted

## 2015-04-23 NOTE — Telephone Encounter (Signed)
Diane Schroeder, Pt's daughter called states that since she is in [place]; they have gone to [facility] for the mammogram and have a surgical consult on 04/29/15 for possible biopsy.  When asked if they are going to come to 05/11/15 appt.  Cindy confirmed that office CAN cancel MD appt but leave flush appt "just in case"  Cindy expressed appreciation for f/u.  Dr. Benay Spice notified of updated information.

## 2015-04-23 NOTE — Telephone Encounter (Signed)
Per Dr. Benay Spice; left voice message for pt re: follow-up from last office visit, if she has agreed or scheduled mammogram and/or biopsy.  Requested pt to call back.

## 2015-05-11 ENCOUNTER — Telehealth: Payer: Self-pay | Admitting: Oncology

## 2015-05-11 ENCOUNTER — Ambulatory Visit (HOSPITAL_BASED_OUTPATIENT_CLINIC_OR_DEPARTMENT_OTHER): Payer: Medicare Other

## 2015-05-11 ENCOUNTER — Ambulatory Visit: Payer: Medicare Other | Admitting: Oncology

## 2015-05-11 VITALS — BP 152/58 | HR 72 | Temp 97.8°F | Resp 16

## 2015-05-11 DIAGNOSIS — Z95828 Presence of other vascular implants and grafts: Secondary | ICD-10-CM

## 2015-05-11 DIAGNOSIS — Z452 Encounter for adjustment and management of vascular access device: Secondary | ICD-10-CM

## 2015-05-11 DIAGNOSIS — Z85038 Personal history of other malignant neoplasm of large intestine: Secondary | ICD-10-CM

## 2015-05-11 MED ORDER — SODIUM CHLORIDE 0.9 % IJ SOLN
10.0000 mL | INTRAMUSCULAR | Status: DC | PRN
  Administered 2015-05-11: 10 mL via INTRAVENOUS
  Filled 2015-05-11: qty 10

## 2015-05-11 MED ORDER — HEPARIN SOD (PORK) LOCK FLUSH 100 UNIT/ML IV SOLN
500.0000 [IU] | Freq: Once | INTRAVENOUS | Status: AC
  Administered 2015-05-11: 500 [IU] via INTRAVENOUS
  Filled 2015-05-11: qty 5

## 2015-05-11 NOTE — Telephone Encounter (Signed)
Gave and printed appt sched and avs for pt for DEC and FEb

## 2015-07-24 ENCOUNTER — Emergency Department (HOSPITAL_COMMUNITY): Payer: Medicare Other

## 2015-07-24 ENCOUNTER — Encounter (HOSPITAL_COMMUNITY): Payer: Self-pay | Admitting: Emergency Medicine

## 2015-07-24 ENCOUNTER — Emergency Department (HOSPITAL_COMMUNITY)
Admission: EM | Admit: 2015-07-24 | Discharge: 2015-07-25 | Disposition: A | Discharge status: Other Acute Inpatient Hospital | Payer: Medicare Other | Attending: Emergency Medicine | Admitting: Emergency Medicine

## 2015-07-24 DIAGNOSIS — R4182 Altered mental status, unspecified: Secondary | ICD-10-CM | POA: Diagnosis present

## 2015-07-24 DIAGNOSIS — K219 Gastro-esophageal reflux disease without esophagitis: Secondary | ICD-10-CM | POA: Diagnosis not present

## 2015-07-24 DIAGNOSIS — Z8639 Personal history of other endocrine, nutritional and metabolic disease: Secondary | ICD-10-CM | POA: Insufficient documentation

## 2015-07-24 DIAGNOSIS — R410 Disorientation, unspecified: Secondary | ICD-10-CM | POA: Diagnosis not present

## 2015-07-24 DIAGNOSIS — F209 Schizophrenia, unspecified: Secondary | ICD-10-CM | POA: Insufficient documentation

## 2015-07-24 DIAGNOSIS — I10 Essential (primary) hypertension: Secondary | ICD-10-CM | POA: Insufficient documentation

## 2015-07-24 DIAGNOSIS — Z87891 Personal history of nicotine dependence: Secondary | ICD-10-CM | POA: Diagnosis not present

## 2015-07-24 DIAGNOSIS — G4700 Insomnia, unspecified: Secondary | ICD-10-CM | POA: Diagnosis not present

## 2015-07-24 DIAGNOSIS — R531 Weakness: Secondary | ICD-10-CM | POA: Diagnosis not present

## 2015-07-24 DIAGNOSIS — R011 Cardiac murmur, unspecified: Secondary | ICD-10-CM | POA: Diagnosis not present

## 2015-07-24 DIAGNOSIS — Z79899 Other long term (current) drug therapy: Secondary | ICD-10-CM | POA: Diagnosis not present

## 2015-07-24 HISTORY — DX: Schizoaffective disorder, bipolar type: F25.0

## 2015-07-24 HISTORY — DX: Schizoaffective disorder, unspecified: F25.9

## 2015-07-24 HISTORY — DX: Pure hypercholesterolemia, unspecified: E78.00

## 2015-07-24 HISTORY — DX: Gastro-esophageal reflux disease without esophagitis: K21.9

## 2015-07-24 HISTORY — DX: Essential (primary) hypertension: I10

## 2015-07-24 LAB — CBC WITH DIFFERENTIAL/PLATELET
BASOS ABS: 0 10*3/uL (ref 0.0–0.1)
Basophils Relative: 0 %
Eosinophils Absolute: 0.1 10*3/uL (ref 0.0–0.7)
Eosinophils Relative: 1 %
HEMATOCRIT: 38.1 % (ref 36.0–46.0)
Hemoglobin: 12.5 g/dL (ref 12.0–15.0)
LYMPHS ABS: 1.8 10*3/uL (ref 0.7–4.0)
LYMPHS PCT: 24 %
MCH: 31.3 pg (ref 26.0–34.0)
MCHC: 32.8 g/dL (ref 30.0–36.0)
MCV: 95.3 fL (ref 78.0–100.0)
MONO ABS: 0.8 10*3/uL (ref 0.1–1.0)
MONOS PCT: 10 %
NEUTROS ABS: 4.9 10*3/uL (ref 1.7–7.7)
Neutrophils Relative %: 65 %
Platelets: 211 10*3/uL (ref 150–400)
RBC: 4 MIL/uL (ref 3.87–5.11)
RDW: 13.7 % (ref 11.5–15.5)
WBC: 7.6 10*3/uL (ref 4.0–10.5)

## 2015-07-24 LAB — COMPREHENSIVE METABOLIC PANEL
ALT: 30 U/L (ref 14–54)
AST: 31 U/L (ref 15–41)
Albumin: 3.8 g/dL (ref 3.5–5.0)
Alkaline Phosphatase: 124 U/L (ref 38–126)
Anion gap: 11 (ref 5–15)
BILIRUBIN TOTAL: 0.4 mg/dL (ref 0.3–1.2)
BUN: 21 mg/dL — AB (ref 6–20)
CO2: 29 mmol/L (ref 22–32)
Calcium: 9.8 mg/dL (ref 8.9–10.3)
Chloride: 102 mmol/L (ref 101–111)
Creatinine, Ser: 0.97 mg/dL (ref 0.44–1.00)
GFR, EST NON AFRICAN AMERICAN: 59 mL/min — AB (ref >60)
Glucose, Bld: 140 mg/dL — ABNORMAL HIGH (ref 65–99)
POTASSIUM: 4 mmol/L (ref 3.5–5.1)
Sodium: 142 mmol/L (ref 135–145)
TOTAL PROTEIN: 7.4 g/dL (ref 6.5–8.1)

## 2015-07-24 LAB — URINALYSIS, ROUTINE W REFLEX MICROSCOPIC
Bilirubin Urine: NEGATIVE
Glucose, UA: NEGATIVE mg/dL
Hgb urine dipstick: NEGATIVE
Ketones, ur: NEGATIVE mg/dL
Leukocytes, UA: NEGATIVE
Nitrite: NEGATIVE
Protein, ur: NEGATIVE mg/dL
Specific Gravity, Urine: 1.02 (ref 1.005–1.030)
pH: 5 (ref 5.0–8.0)

## 2015-07-24 LAB — ETHANOL: Alcohol, Ethyl (B): <5 mg/dL (ref <5)

## 2015-07-24 MED ORDER — ALUM & MAG HYDROXIDE-SIMETH 200-200-20 MG/5ML PO SUSP: 30.0000 mL | ORAL | Status: DC | PRN

## 2015-07-24 MED ORDER — ZOLPIDEM TARTRATE 5 MG PO TABS: 5.0000 mg | ORAL_TABLET | Freq: Every evening | ORAL | Status: DC | PRN

## 2015-07-24 MED ORDER — IBUPROFEN 200 MG PO TABS: 400.0000 mg | ORAL_TABLET | Freq: Three times a day (TID) | ORAL | Status: DC | PRN

## 2015-07-24 MED ORDER — ONDANSETRON HCL 4 MG PO TABS: 4.0000 mg | ORAL_TABLET | Freq: Three times a day (TID) | ORAL | Status: DC | PRN

## 2015-07-24 MED ORDER — NICOTINE 21 MG/24HR TD PT24
21.0000 mg | MEDICATED_PATCH | Freq: Every day | TRANSDERMAL | Status: DC
  Filled 2015-07-24: qty 1

## 2015-07-24 MED ORDER — LORAZEPAM 1 MG PO TABS
1.0000 mg | ORAL_TABLET | Freq: Three times a day (TID) | ORAL | Status: DC | PRN
  Administered 2015-07-24: 1 mg via ORAL
  Filled 2015-07-24: qty 1

## 2015-07-24 MED ORDER — ACETAMINOPHEN 325 MG PO TABS: 650.0000 mg | ORAL_TABLET | ORAL | Status: DC | PRN

## 2015-07-24 NOTE — Consult Note (Signed)
Telepsych Consultation   Reason for Consult:  Mental Status change and insomnia Referring Physician:  Clayton Bibles Patient Identification: Diane Schroeder MRN:  [id number] Principal Diagnosis: <principal problem not specified> Diagnosis:  There are no active problems to display for this patient.   Total Time spent with patient: 45 minutes  Subjective:   Diane Schroeder is a 70 y.o. female patient admitted with hx of Schizophrenia presents with insomnia of more than 5 days and mental status change.   HPI: Caucasian  female, 70 y.o with history of Schizophrenia presents to the ED with more than 5 days insomnia and mental status change. Patient had a mastectomy 1 month ago p/w worsening insomnia, intermittent altered mental status, unsteady gait and generalized weakness.   Daughter reports insomnia was rountinely managed with Temazepam 11m, Diazepam 599mand Melatonin 23m423mwith an order to take extra dose of Diazepam if unable to sleep after 3 days.  Daughter reports that  this worked for Patient  in the past but not working currently.  Daughter reports that Patient has not slept in more than 5 days.  She was able to take cat naps of a few hours.  [facility] 2 [facility] for the same sleeping issues  and was diagnosed with Insomnia and no other treatment.  Patient's psychiatrist encouraged Patient to come to this ED for neurological and psychological evaluation.     Daughter reports Patient started getting confused sometime around June of 2016 when she was battling with some health issues. Daughter reports that her confusion has gotten worse in the last 2 weeks and Patient has become more unsafe.  Daughter is worried that Patient is becoming more of a danger to self.  Earlier today at the ED, Patient attempted to walk with unsteady gait and while urinating on self.  She ignored the reminders to sit back down.  Patient says she gets up knowing that she is not supposed to get up  but unable to say why she does that. Daughter reports  at times her mom appears to be in a daze or trance but snaps out of it after some time.  Patient is alert and oriented x4, to person, place, time and situation.  She is able to the name the current President [place] as [place], states correctly [facility]  [facility] gives her home address and date of birth, correctly states that she is one of 13 25ildren [place] her daughter.  She denies AVH.  Her daughter reminds her of talking to "AnnLelon Frohlicha few weeks ago.  She claims that "AnnLelon Frohlichs her husband's fiancee but Daughter reports that late dad did not have a fiancee.  Patient denies paranoia and delusions.     Past Medical History:  Past Medical History  Diagnosis Date  . Hypertension   . [place] affective [place] . Hypercholesteremia   . GERD (gastroesophageal reflux disease)     Past Surgical History  Procedure Laterality Date  . Mastectomy Right    Family History: History reviewed. No pertinent family history. Social History:  History  Alcohol Use No     History  Drug Use No    Social History   Social History  . Marital Status: Married    Spouse Name: N/A  . Number of Children: N/A  . Years of Education: N/A   Social History Main Topics  .  Smoking status: Former Research scientist (life sciences)  . Smokeless tobacco: None  . Alcohol Use: No  . Drug Use: No  . Sexual Activity: Not Asked   Other Topics Concern  . None   Social History Narrative   Widow, lives with daughter   Additional Social History:                          Allergies:   Allergies  Allergen Reactions  . Flu Virus Vaccine Other (See Comments)    Patient reports Rosalee Kaufman after flu shot in the past (unknown year)    Labs:  Results for orders placed or performed during the hospital encounter of 07/24/15 (from the past 48 hour(s))  Urinalysis, Routine w reflex  microscopic (not at [facility])     Status: Abnormal   Collection Time: 07/24/15  4:50 PM  Result Value Ref Range   Color, Urine YELLOW YELLOW   APPearance CLOUDY (A) CLEAR   Specific Gravity, Urine 1.020 1.005 - 1.030   pH 5.0 5.0 - 8.0   Glucose, UA NEGATIVE NEGATIVE mg/dL   Hgb urine dipstick NEGATIVE NEGATIVE   Bilirubin Urine NEGATIVE NEGATIVE   Ketones, ur NEGATIVE NEGATIVE mg/dL   Protein, ur NEGATIVE NEGATIVE mg/dL   Nitrite NEGATIVE NEGATIVE   Leukocytes, UA NEGATIVE NEGATIVE    Comment: MICROSCOPIC NOT DONE ON URINES WITH NEGATIVE PROTEIN, BLOOD, LEUKOCYTES, NITRITE, OR GLUCOSE <1000 mg/dL.  CBC with Differential     Status: None   Collection Time: 07/24/15  5:46 PM  Result Value Ref Range   WBC 7.6 4.0 - 10.5 K/uL   RBC 4.00 3.87 - 5.11 MIL/uL   Hemoglobin 12.5 12.0 - 15.0 g/dL   HCT 38.1 36.0 - 46.0 %   MCV 95.3 78.0 - 100.0 fL   MCH 31.3 26.0 - 34.0 pg   MCHC 32.8 30.0 - 36.0 g/dL   RDW 13.7 11.5 - 15.5 %   Platelets 211 150 - 400 K/uL   Neutrophils Relative % 65 %   Neutro Abs 4.9 1.7 - 7.7 K/uL   Lymphocytes Relative 24 %   Lymphs Abs 1.8 0.7 - 4.0 K/uL   Monocytes Relative 10 %   Monocytes Absolute 0.8 0.1 - 1.0 K/uL   Eosinophils Relative 1 %   Eosinophils Absolute 0.1 0.0 - 0.7 K/uL   Basophils Relative 0 %   Basophils Absolute 0.0 0.0 - 0.1 K/uL  Comprehensive metabolic panel     Status: Abnormal   Collection Time: 07/24/15  5:46 PM  Result Value Ref Range   Sodium 142 135 - 145 mmol/L   Potassium 4.0 3.5 - 5.1 mmol/L   Chloride 102 101 - 111 mmol/L   CO2 29 22 - 32 mmol/L   Glucose, Bld 140 (H) 65 - 99 mg/dL   BUN 21 (H) 6 - 20 mg/dL   Creatinine, Ser 0.97 0.44 - 1.00 mg/dL   Calcium 9.8 8.9 - 10.3 mg/dL   Total Protein 7.4 6.5 - 8.1 g/dL   Albumin 3.8 3.5 - 5.0 g/dL   AST 31 15 - 41 U/L   ALT 30 14 - 54 U/L   Alkaline Phosphatase 124 38 - 126 U/L   Total Bilirubin 0.4 0.3 - 1.2 mg/dL   GFR calc non Af Amer 59 (L) >60 mL/min   GFR calc Af Amer >60  >60 mL/min    Comment: (NOTE) The eGFR has been calculated using the CKD EPI equation. This calculation has not  been validated in all clinical situations. eGFR's persistently <60 mL/min signify possible Chronic Kidney Disease.    Anion gap 11 5 - 15  Ethanol     Status: None   Collection Time: 07/24/15  5:46 PM  Result Value Ref Range   Alcohol, Ethyl (B) <5 <5 mg/dL    Comment:        LOWEST DETECTABLE LIMIT FOR SERUM ALCOHOL IS 5 mg/dL FOR MEDICAL PURPOSES ONLY     Vitals: Blood pressure 141/62, pulse 102, temperature 98.2 F (36.8 C), temperature source Oral, resp. rate 17, SpO2 96 %.  Risk to Self: Is patient at risk for suicide?: No Risk to Others:   Prior Inpatient Therapy:   Prior Outpatient Therapy:    No current facility-administered medications for this encounter.   Current Outpatient Prescriptions  Medication Sig Dispense Refill  . acetaminophen (TYLENOL) 325 MG tablet Take 650 mg by mouth every 6 (six) hours as needed for moderate pain.     Marland Kitchen amLODipine (NORVASC) 10 MG tablet Take 10 mg by mouth daily.    Marland Kitchen atorvastatin (LIPITOR) 40 MG tablet Take 1 tablet by mouth daily.    . Cholecalciferol (VITAMIN D3) 1000 units CAPS Take 1,000 Units by mouth daily.    . diazepam (VALIUM) 5 MG tablet Take 5 mg by mouth 2 (two) times daily.     . halobetasol (ULTRAVATE) 0.05 % cream Apply 1 application topically daily.   0  . haloperidol (HALDOL) 2 MG tablet Take 2 mg by mouth 2 (two) times daily.    . hydrOXYzine (ATARAX/VISTARIL) 10 MG tablet Take 10 mg by mouth 2 (two) times daily as needed for itching.    Marland Kitchen ketoconazole (NIZORAL) 2 % cream Apply 1 application topically every Friday.    . lidocaine-prilocaine (EMLA) cream Apply small amount over port area 1-2 hours prior to flush appt and cover with plastic wrap 30 g prn  . metoprolol tartrate (LOPRESSOR) 25 MG tablet Take 12.5 mg by mouth 2 (two) times daily. Take 12.5 mg BID    . Multiple Vitamin (MULTIVITAMIN) tablet  Take 1 tablet by mouth daily.    . nortriptyline (PAMELOR) 25 MG capsule Take 50 mg by mouth at bedtime.  2  . oxyCODONE-acetaminophen (PERCOCET/ROXICET) 5-325 MG tablet Take 1 tablet by mouth every 4 (four) hours as needed. pain    . promethazine (PHENERGAN) 25 MG tablet Take 1 tablet (25 mg total) by mouth every 6 (six) hours as needed. (Patient taking differently: Take 25 mg by mouth every 6 (six) hours as needed for nausea or vomiting. ) 30 tablet 0  . ranitidine (ZANTAC) 150 MG tablet Take 150 mg by mouth daily.     . temazepam (RESTORIL) 15 MG capsule Take 15 mg by mouth at bedtime.  5    Musculoskeletal: Strength & Muscle Tone: Weakness and unsteady Gait & Station: unsteady Patient leans: Right  Psychiatric Specialty Exam: Physical Exam  ROS  Blood pressure 141/62, pulse 102, temperature 98.2 F (36.8 C), temperature source Oral, resp. rate 17, SpO2 96 %.There is no weight on file to calculate BMI.  General Appearance: Fairly Groomed  Engineer, water::  Fair  Speech:  Clear and Coherent and Slow  Volume:  Decreased  Mood:  Depressed  Affect:  Flat  Thought Process:  Coherent and logical at times  Orientation:  Full (Time, Place, and Person)  Thought Content:  WDL  Suicidal Thoughts:  No  Homicidal Thoughts:  No  Memory:  Immediate;  Fair Recent;   Fair Remote;   Fair  Judgement:  Impaired  Insight:  Lacking  Psychomotor Activity:  Normal  Concentration:  Fair  Recall:  [employer] of Knowledge:Fair  Language: Good  Akathisia:  Negative  Handed:  Right  AIMS (if indicated):     Assets:  Communication Skills Desire for Improvement Financial Resources/Insurance Housing Leisure Time Resilience Social Support Transportation  ADL's:  Impaired  Cognition: WNL  Sleep:      Medical Decision Making: Review of Psycho-Social Stressors (1), Review or order clinical lab tests (1), Discuss test with performing physician (1), Review or order medicine tests (1) and Review of  Medication Regimen & Side Effects (2)   Treatment Plan Summary: Daily contact with patient to assess and evaluate symptoms and progress in treatment Medication Management Case staffed with Clayton Bibles, PA, ED Plan to seek placement in a geriatric psychiatric  Inpatient facility  Plan:  Recommend psychiatric Inpatient admission when medically cleared.  Disposition: Admit to geriatric psychiatric inpatient facility.  Samantha Crimes, PMHNP-BC 07/24/2015 10:13 PM

## 2015-07-24 NOTE — ED Notes (Signed)
Attempted blood draw x1, was unsuccessful

## 2015-07-24 NOTE — Progress Notes (Addendum)
Patient meets inpatient criteria, per NP Arlester Marker.  Patient to be referred to: [facility] - per [facility] - per intake, fax for the waitlist. [facility] - per Prudence Davidson, female adolescent and geri female and female beds only. Fax referral for waitlist. [facility] - per Daleen Snook, geriatric beds open. [facility], beds open on geriatric and psych-medical unit. [facility] - left voicemail. [facility] - per Dub Mikes, some beds open.  Verlon Setting, [place] Disposition staff 07/24/2015 10:46 PM

## 2015-07-24 NOTE — ED Notes (Signed)
Pt from home. Pt had a R masectomy a month ago. For the past week pt has been having altered mental status. Family reports pt has been confused and not sleeping well. Has had difficulty walking and leans to one side when sitting. Had an episode of incontinence yesterday. Has been to another ED for same. Pt was told to come here by PCP Plovski [phone]) for neuro evaluation.

## 2015-07-24 NOTE — ED Notes (Signed)
Surgical incision on the right chest healing with no signs of infection.

## 2015-07-24 NOTE — ED Provider Notes (Signed)
CSN: [id number]     Arrival date & time 07/24/15  1611 History   First MD Initiated Contact with Patient 07/24/15 1643     Chief Complaint  Patient presents with  . Weakness  . Altered Mental Status     (Consider location/radiation/quality/duration/timing/severity/associated sxs/prior Treatment) The history is provided by the patient, a relative and medical records.     Pt with hx schizophrenia, HTN, HLD, right mastectomy 1 month ago p/w worsening insomnia, intermittent altered mental status, unsteady gait, generalized weakness, leaning to the right.  Per daughter (pt's primary caregiver), pt has a baseline of being awake for 2-3 days then crashing 24 hours.  States over the past 6 days she has slept only short periods of time, the longest was 5 hours this morning, otherwise never longer than 3 hours.  This morning when she woke up she was "talking out of her mind," was unable to hold herself up, could not comprehend what daughter was saying, and was leaning/slumped to the right.  Even now pt is not following all commands, decided to get up and attempt walking while still urinating (while in ED).  Has been seen at [facility] ED the past 2 nights and diagnosed with insomnia.  Pt's psychiatrist encouraged pt to come to this ED for neurological and psychological evaluation.  Pt/daughter deny any recent sick symptoms, any pain anywhere, SOB, vomiting, diarrhea, urinary problems.  Daughter denies recent leg swelling.    Past Medical History  Diagnosis Date  . Hypertension   . Schizo affective schizophrenia ([place])   . Hypercholesteremia   . GERD (gastroesophageal reflux disease)    Past Surgical History  Procedure Laterality Date  . Mastectomy Right    History reviewed. No pertinent family history. Social History  Substance Use Topics  . Smoking status: Former Research scientist (life sciences)  . Smokeless tobacco: None  . Alcohol Use: No   OB History    No data available     Review of Systems  All  other systems reviewed and are negative.     Allergies  Review of patient's allergies indicates no known allergies.  Home Medications   Prior to Admission medications   Medication Sig Start Date End Date Taking? Authorizing Provider  acetaminophen (TYLENOL) 325 MG tablet Take 650 mg by mouth every 6 (six) hours as needed.    Historical Provider, MD  amLODipine (NORVASC) 10 MG tablet Take 10 mg by mouth daily.    Historical Provider, MD  diazepam (VALIUM) 5 MG tablet Take 5 mg by mouth every 8 (eight) hours as needed.    Historical Provider, MD  halobetasol (ULTRAVATE) 0.05 % cream  08/04/14   Historical Provider, MD  haloperidol (HALDOL) 2 MG tablet Take 2 mg by mouth 2 (two) times daily.    Historical Provider, MD  hydrOXYzine (ATARAX/VISTARIL) 10 MG tablet Take 10 mg by mouth 2 (two) times daily as needed for itching. 10/14/13   Arlis Porta, MD  lidocaine-prilocaine (EMLA) cream Apply small amount over port area 1-2 hours prior to flush appt and cover with plastic wrap 12/11/13   Ladell Pier, MD  metoprolol tartrate (LOPRESSOR) 25 MG tablet Take 12.5 mg by mouth 2 (two) times daily. Take 12.5 mg BID    Historical Provider, MD  nortriptyline (PAMELOR) 25 MG capsule Take 50 mg by mouth at bedtime. 09/16/14   Historical Provider, MD  PRESCRIPTION MEDICATION Apply topically daily as needed (prn psoriasis). 10/14/13   Arlis Porta, MD  promethazine [facility])  25 MG tablet Take 1 tablet (25 mg total) by mouth every 6 (six) hours as needed. 10/10/14   Ladell Pier, MD  ranitidine (ZANTAC) 150 MG tablet Take 150 mg by mouth 2 (two) times daily.    Historical Provider, MD  temazepam (RESTORIL) 15 MG capsule Take 15 mg by mouth at bedtime. 09/15/14   Historical Provider, MD   BP 119/63 mmHg  Pulse 117  Temp(Src) 98.2 F (36.8 C) (Oral)  Resp 18  SpO2 96% Physical Exam  Constitutional: She is oriented to person, place, and time. She appears well-developed and well-nourished. No distress.   HENT:  Head: Normocephalic and atraumatic.  Neck: Neck supple.  Cardiovascular: Normal rate and regular rhythm.   Murmur heard. Pulmonary/Chest: Effort normal and breath sounds normal. No respiratory distress. She has no wheezes. She has no rales.  Right breast, mastectomy incision is clean/dry/intact.  No erythema, edema, warmth, discharge, or tenderness.   Abdominal: Soft. She exhibits no distension. There is no tenderness. There is no rebound and no guarding.  Colostomy bag in place  Musculoskeletal: She exhibits no edema or tenderness.  Neurological: She is alert and oriented to person, place, and time. No cranial nerve deficit.  Uses all extremities equally, sensation intact.  Gait is slow, small but equal steps, no leaning or falling, requires minimal assistance.    Skin: She is not diaphoretic.  Psychiatric:  Flat affect, delayed speech, appears to sleep during most of interaction with family, during exam  Nursing note and vitals reviewed.   ED Course  Procedures (including critical care time) Labs Review Labs Reviewed  URINALYSIS, ROUTINE W REFLEX MICROSCOPIC (NOT AT [facility]) - Abnormal; Notable for the following:    APPearance CLOUDY (*)    All other components within normal limits  COMPREHENSIVE METABOLIC PANEL - Abnormal; Notable for the following:    Glucose, Bld 140 (*)    BUN 21 (*)    GFR calc non Af Amer 59 (*)    All other components within normal limits  URINE CULTURE  CBC WITH DIFFERENTIAL/PLATELET  ETHANOL    Imaging Review Dg Chest 1 View  07/24/2015  CLINICAL DATA:  Altered mental status. EXAM: CHEST 1 VIEW COMPARISON:  07/02/2006 FINDINGS: Right Port-A-Cath remains in place, unchanged. Heart and mediastinal contours are within normal limits. No focal opacities or effusions. No acute bony abnormality. IMPRESSION: No active cardiopulmonary disease. Electronically Signed   By: Rolm Baptise M.D.   On: 07/24/2015 17:40   Ct Head Wo Contrast  07/24/2015   CLINICAL DATA:  Altered mental status for the past week. Confusion. Initial encounter. EXAM: CT HEAD WITHOUT CONTRAST TECHNIQUE: Contiguous axial images were obtained from the base of the skull through the vertex without intravenous contrast. COMPARISON:  Head CT scan 07/02/2006. FINDINGS: There has been progression of cortical atrophy and chronic microvascular ischemic change since the prior examination. 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 No evidence of acute abnormality including infarct, hemorrhage, mass lesion, mass effect, midline shift or abnormal extra-axial fluid collection is identified. No hydrocephalus or pneumocephalus. The calvarium is intact. IMPRESSION: No acute abnormality. Progressive atrophy and chronic microvascular ischemic change since the patient's 2008 exam. Electronically Signed   By: Inge Rise M.D.   On: 07/24/2015 17:34   I have personally reviewed and evaluated these images and lab results as part of my medical decision-making.   EKG Interpretation   Date/Time:  Friday July 24 2015 17:56:35 EST  Ventricular Rate:  94 PR Interval:  216 QRS Duration: 94 QT Interval:  343 QTC Calculation: 429 R Axis:   27 Text Interpretation:  Sinus rhythm Prolonged PR interval Abnormal R-wave  progression, early transition Confirmed by Wilson Singer  MD, [place] ([id number]) on  07/24/2015 6:05:03 PM       6:55 PM Discussed pt with Dr Wilson Singer.    10:57 PM I spoke with Psychiatric NP who has seen the patient.   She will speak with TTS, and attempt placement in geriatric psych unit.    MDM   Final diagnoses:  Generalized weakness  Insomnia  Intermittent confusion    Afebrile nontoxic but sleepy patient with flat affect p/w sleeplessness, intermittent altered mental status, abnormality of gait, generalized weakness, slumping to the right.  Neurologic testing unremarkable.  CT head negative.   CXR negative.  UA negative.  Labs  unremarkable.  Doubt acute infection or postoperative issue.  Doubt stroke.  Suspect exacerbation of chronic psychological issues.  Pt seen by psych NP who agrees to geriatric psych placement for observation.  Pt also seen by Dr Wilson Singer who agrees with workup and plan.  Pt remains in ED, placement pending.       Clayton Bibles, PA-C 07/25/15 [id number]  Virgel Manifold, MD 07/29/15 [phone]

## 2015-07-24 NOTE — ED Notes (Signed)
In the bathroom with daughter.

## 2015-07-24 NOTE — ED Notes (Signed)
PA at bedside.

## 2015-07-25 DIAGNOSIS — G4700 Insomnia, unspecified: Secondary | ICD-10-CM | POA: Diagnosis present

## 2015-07-25 DIAGNOSIS — R531 Weakness: Secondary | ICD-10-CM | POA: Diagnosis not present

## 2015-07-25 DIAGNOSIS — F209 Schizophrenia, unspecified: Secondary | ICD-10-CM | POA: Diagnosis present

## 2015-07-25 LAB — RAPID URINE DRUG SCREEN, HOSP PERFORMED
AMPHETAMINES: NOT DETECTED
Barbiturates: NOT DETECTED
Benzodiazepines: POSITIVE — AB
COCAINE: NOT DETECTED
OPIATES: NOT DETECTED
TETRAHYDROCANNABINOL: NOT DETECTED

## 2015-07-25 MED ORDER — DIAZEPAM 5 MG PO TABS
5.0000 mg | ORAL_TABLET | Freq: Two times a day (BID) | ORAL | Status: DC
Start: 2015-07-25 — End: 2015-07-25
  Administered 2015-07-25: 5 mg via ORAL
  Filled 2015-07-25: qty 1

## 2015-07-25 MED ORDER — METOPROLOL TARTRATE 25 MG PO TABS
12.5000 mg | ORAL_TABLET | Freq: Two times a day (BID) | ORAL | Status: DC
  Administered 2015-07-25: 12.5 mg via ORAL
  Filled 2015-07-25: qty 1

## 2015-07-25 MED ORDER — VITAMIN D3 25 MCG (1000 UNIT) PO TABS
1000.0000 [IU] | ORAL_TABLET | Freq: Every day | ORAL | Status: DC
  Administered 2015-07-25: 1000 [IU] via ORAL
  Filled 2015-07-25: qty 1

## 2015-07-25 MED ORDER — AMLODIPINE BESYLATE 10 MG PO TABS
10.0000 mg | ORAL_TABLET | Freq: Every day | ORAL | Status: DC
  Administered 2015-07-25: 10 mg via ORAL
  Filled 2015-07-25: qty 1

## 2015-07-25 MED ORDER — FAMOTIDINE 20 MG PO TABS
20.0000 mg | ORAL_TABLET | Freq: Two times a day (BID) | ORAL | Status: DC
  Administered 2015-07-25: 20 mg via ORAL
  Filled 2015-07-25: qty 1

## 2015-07-25 MED ORDER — ATORVASTATIN CALCIUM 40 MG PO TABS
40.0000 mg | ORAL_TABLET | Freq: Every day | ORAL | Status: DC
  Administered 2015-07-25: 40 mg via ORAL
  Filled 2015-07-25: qty 1

## 2015-07-25 MED ORDER — TEMAZEPAM 15 MG PO CAPS: 15.0000 mg | ORAL_CAPSULE | Freq: Every day | ORAL | Status: DC

## 2015-07-25 MED ORDER — HALOPERIDOL 2 MG PO TABS
2.0000 mg | ORAL_TABLET | Freq: Two times a day (BID) | ORAL | Status: DC
  Administered 2015-07-25: 2 mg via ORAL
  Filled 2015-07-25: qty 1

## 2015-07-25 MED ORDER — NORTRIPTYLINE HCL 25 MG PO CAPS
50.0000 mg | ORAL_CAPSULE | Freq: Every day | ORAL | Status: DC
  Filled 2015-07-25: qty 2

## 2015-07-25 NOTE — Clinical Social Work Note (Signed)
CSW spoke with pt's daughter to facilitate her HCPOA paperwork.  Diane Schroeder is requesting this before they can send consent for pt inpatient bed.  Pt's daughter stated that she would get dressed and come to the hospital.  She could not a time.  [place] ED [phone]

## 2015-07-25 NOTE — ED Notes (Signed)
Awake. Verbally responsive. A/O x4. Resp even and unlabored. No audible adventitious breath sounds noted. ABC's intact. Pt eaten breakfast without difficulty. Bed alarm on bed. Pt able to ambulate but needing standy-by assistance.

## 2015-07-25 NOTE — BHH Counselor (Signed)
Contacted Shirlee Limerick at [facility] to inform her that patients daughter will bring HCPOA and sign patient in later today. There was no answer and this writer was transferred to another line with no answer.  Will inform CSW that this writer call and there was no answer.   Rosalin Hawking, LCSW Therapeutic Triage Specialist [place] 07/25/2015 8:53 AM

## 2015-07-25 NOTE — ED Notes (Signed)
Awake. Verbally responsive. A/O x4. Resp even and unlabored. No audible adventitious breath sounds noted. ABC's intact.  

## 2015-07-25 NOTE — ED Notes (Signed)
Awake. Verbally responsive. A/O x4. Resp even and unlabored. No audible adventitious breath sounds noted. ABC's intact. Pt assisted OOB to bedside recliner. Family at bedside.

## 2015-07-25 NOTE — BHH Counselor (Signed)
Received a call from [place] at [facility] stating that the patient may have a bed available if the HCPOA is available to sign her in or if the patient is placed under IVC.   Informed CSW Rollene Fare who will phone patients daughter to request that she bring HCPOA paperwork and sign the patient in.  Rosalin Hawking, LCSW Therapeutic Triage Specialist [place] 07/25/2015 8:48 AM

## 2015-07-25 NOTE — ED Notes (Signed)
Called and reported to [place], Therapist, sports at [employer]

## 2015-07-25 NOTE — ED Notes (Signed)
Called [facility] to arrange for transport.

## 2015-07-25 NOTE — Progress Notes (Signed)
Spoke with [facility] who is asking for UDS as well as if pt has a HCPOA. Pt does have a HCPOA and UDS being sent now.  They will reassess when results available.

## 2015-07-25 NOTE — ED Notes (Signed)
Resting quietly with eye closed. Easily arousable. Verbally responsive. Resp even and unlabored. ABC's intact. No behavior problems noted. NAD noted. Sitter at bedside.  

## 2015-07-25 NOTE — Progress Notes (Signed)
Pt c/o being very sore and stiff when she first arises from the bed.  She ambulates much better once moves around a bit.  She has a scaped area on right lower back/upper sacral area and a bruise behind her right knee.  Pt states she is so sore because she slipped off the side of her bed during the night and stayed on the floor until someone else awakened in the morning. States it was many hours but unsure how many.

## 2015-07-25 NOTE — ED Notes (Signed)
Resting quietly with eye closed. Easily arousable. Verbally responsive. Resp even and unlabored. ABC's intact. No behavior problems noted. Pt denies SI/HI and audilble/visual hallucinations. NAD noted. Sitter at bedside.

## 2015-07-25 NOTE — ED Notes (Signed)
Awake. Verbally responsive. A/O x4. Resp even and unlabored. No audible adventitious breath sounds noted. ABC's intact. Pt eating meal. Family at bedside. Pt refused x2 to take a shower.

## 2015-07-25 NOTE — ED Notes (Signed)
Pt ate 100% of her breakfast

## 2015-07-25 NOTE — BH Assessment (Signed)
Pt's Chauncey Reading is daughter Ottie Glazier [phone]. Please update her on pt's placement.

## 2015-07-25 NOTE — ED Notes (Addendum)
Awake. Verbally responsive. A/O x4. Resp even and unlabored. No audible adventitious breath sounds noted. ABC's intact. Pt watching TV. Pt pleasant and cooperative behavior. Psych MD and NP at bedside.

## 2015-07-25 NOTE — Clinical Social Work Note (Addendum)
CSW faxed pt's HCPOA to [place].  CSW called and asked [place] to send consent form for HCPOA to Sign pt into inpatient treatment at [facility]. Also requesting admitting doctor for [place].  12:21pm pt going into room [place] [facility] [phone]

## 2015-07-25 NOTE — ED Notes (Signed)
Awake. Verbally responsive. A/O x4. Resp even and unlabored. No audible adventitious breath sounds noted. ABC's intact. No behavior problems noted. Family at bedside.

## 2015-07-26 LAB — URINE CULTURE: Special Requests: NORMAL

## 2015-08-04 ENCOUNTER — Other Ambulatory Visit: Payer: Medicare Other

## 2015-08-04 ENCOUNTER — Ambulatory Visit: Payer: Medicare Other | Admitting: Oncology

## 2018-11-26 DEATH — deceased
